# Patient Record
Sex: Female | Born: 1970 | Race: White | Hispanic: No | Smoking: Never smoker
Health system: Southern US, Community
[De-identification: ages and names within clinical notes are randomized; demographics above are authoritative.]

## PROBLEM LIST (undated history)

## (undated) DIAGNOSIS — R200 Anesthesia of skin: Secondary | ICD-10-CM

## (undated) DIAGNOSIS — R011 Cardiac murmur, unspecified: Secondary | ICD-10-CM

## (undated) DIAGNOSIS — R202 Paresthesia of skin: Secondary | ICD-10-CM

## (undated) HISTORY — DX: Anesthesia of skin: R20.0

## (undated) HISTORY — PX: TUBAL LIGATION: SHX77

## (undated) HISTORY — DX: Anesthesia of skin: R20.2

## (undated) HISTORY — PX: ABLATION: SHX5711

---

## 2007-02-01 ENCOUNTER — Emergency Department (HOSPITAL_COMMUNITY): Admission: EM | Admit: 2007-02-01 | Discharge: 2007-02-01 | Payer: Self-pay | Admitting: Emergency Medicine

## 2007-06-25 ENCOUNTER — Emergency Department (HOSPITAL_COMMUNITY): Admission: EM | Admit: 2007-06-25 | Discharge: 2007-06-26 | Payer: Self-pay | Admitting: Emergency Medicine

## 2007-07-19 ENCOUNTER — Encounter (INDEPENDENT_AMBULATORY_CARE_PROVIDER_SITE_OTHER): Payer: Self-pay | Admitting: Gynecology

## 2007-07-19 ENCOUNTER — Ambulatory Visit: Payer: Self-pay | Admitting: Gynecology

## 2008-03-10 ENCOUNTER — Emergency Department (HOSPITAL_COMMUNITY): Admission: EM | Admit: 2008-03-10 | Discharge: 2008-03-10 | Payer: Self-pay | Admitting: Emergency Medicine

## 2008-03-11 ENCOUNTER — Ambulatory Visit: Payer: Self-pay | Admitting: Oncology

## 2008-03-24 LAB — CBC WITH DIFFERENTIAL/PLATELET
BASO%: 0.6 % (ref 0.0–2.0)
Basophils Absolute: 0 10*3/uL (ref 0.0–0.1)
EOS%: 2.1 % (ref 0.0–7.0)
HCT: 38.9 % (ref 34.8–46.6)
HGB: 13.3 g/dL (ref 11.6–15.9)
MCH: 29.9 pg (ref 25.1–34.0)
MCHC: 34.2 g/dL (ref 31.5–36.0)
MCV: 87.6 fL (ref 79.5–101.0)
MONO%: 8.1 % (ref 0.0–14.0)
NEUT%: 61.3 % (ref 38.4–76.8)
RDW: 13.5 % (ref 11.2–14.5)
lymph#: 1.6 10*3/uL (ref 0.9–3.3)

## 2008-03-24 LAB — MORPHOLOGY: PLT EST: ADEQUATE

## 2008-03-27 LAB — VON WILLEBRAND PANEL
Ristocetin-Cofactor: 87 % (ref 50–150)
Von Willebrand Ag: 109 % normal (ref 61–164)

## 2008-05-11 ENCOUNTER — Emergency Department (HOSPITAL_COMMUNITY): Admission: EM | Admit: 2008-05-11 | Discharge: 2008-05-11 | Payer: Self-pay | Admitting: Emergency Medicine

## 2008-10-10 ENCOUNTER — Emergency Department (HOSPITAL_COMMUNITY): Admission: EM | Admit: 2008-10-10 | Discharge: 2008-10-10 | Payer: Self-pay | Admitting: Emergency Medicine

## 2010-04-30 LAB — URINALYSIS, ROUTINE W REFLEX MICROSCOPIC
Glucose, UA: NEGATIVE mg/dL
Ketones, ur: NEGATIVE mg/dL
Specific Gravity, Urine: 1.011 (ref 1.005–1.030)
pH: 7.5 (ref 5.0–8.0)

## 2010-05-11 LAB — DIFFERENTIAL
Basophils Absolute: 0 10*3/uL (ref 0.0–0.1)
Basophils Relative: 0 % (ref 0–1)
Eosinophils Absolute: 0.1 10*3/uL (ref 0.0–0.7)
Neutro Abs: 3.4 10*3/uL (ref 1.7–7.7)
Neutrophils Relative %: 65 % (ref 43–77)

## 2010-05-11 LAB — CBC
MCHC: 33.8 g/dL (ref 30.0–36.0)
MCV: 88.7 fL (ref 78.0–100.0)
RDW: 13.8 % (ref 11.5–15.5)

## 2010-05-11 LAB — COMPREHENSIVE METABOLIC PANEL
ALT: 22 U/L (ref 0–35)
AST: 23 U/L (ref 0–37)
Albumin: 4.5 g/dL (ref 3.5–5.2)
Alkaline Phosphatase: 76 U/L (ref 39–117)
Chloride: 106 mEq/L (ref 96–112)
GFR calc Af Amer: >60 mL/min (ref >60)
Potassium: 3.7 mEq/L (ref 3.5–5.1)
Sodium: 142 mEq/L (ref 135–145)
Total Bilirubin: 0.6 mg/dL (ref 0.3–1.2)
Total Protein: 7.7 g/dL (ref 6.0–8.3)

## 2010-05-11 LAB — PROTIME-INR: INR: 1 (ref 0.00–1.49)

## 2010-05-11 LAB — APTT: aPTT: 30 seconds (ref 24–37)

## 2010-06-08 NOTE — Group Therapy Note (Signed)
NAME:  Rebekah Reed, Rebekah Reed NO.:  [id number]   MEDICAL RECORD NO.:  [id number]          PATIENT TYPE:  WOC   LOCATION:  [facility]                   FACILITY:  [facility]   PHYSICIAN:  Ginger Carne, MD DATE OF BIRTH:  [birth year]   DATE OF SERVICE:                                  CLINIC NOTE   HISTORY OF PRESENT ILLNESS:  Ms. Rebekah Reed is here today as a 40 year old  multiparous Caucasian female who presented to the emergency department  at [facility] on June 25, 2007 because of a heavy menstrual  cycle.  Usually her menses are every 28-30 days lasting 4-5 days and  moderate in amount.  The patient states that this menses was  particularly heavy and unusual for her.  Evaluation at the time revealed  the patient to be dehydrated with a hemoglobin of 12.8.  At the time,  she was hemodynamically stable and orthostatic signs were normal.  The  patient is here today for further evaluation.   She states that until this past menses, her usual cycle has not changed  and she has no intermenstrual bleeding or pain.  She takes no  medications to enhance her bleeding propensity and has no personal  family history of bleeding diatheses.   The patient has not had a Pap smear in 7 years since the delivery of her  last child.  She has had a bilateral tubal ligation 7 years ago.  She is  a gravida 3, para 3 and has no medical or surgical problems of note.   ALLERGIES:  She is ALLERGIC TO PENICILLIN.   MEDICATIONS:  Takes no medications.   SALIENT PHYSICAL FINDINGS:  VITAL SIGNS:  Blood pressure 106/65, weight  162 pounds, height 63 inches.  ABDOMEN:  Soft without gross hepatosplenomegaly.  EXTERNAL GENITALIA/PELVIC:  Vulva and vagina normal.  Cervix smooth  without erosions or lesions.  Pap smear performed.  Uterus small,  anteverted and flexed.  Both adnexa palpable found to be normal.   IMPRESSION:  Abnormal menses.   PLAN:  The patient has had one episode of  abnormal menses and is in  agreement that the best course of action at this point is to keep track  of her menses over the next several months.  If they continue to worsen,  she would be a candidate for an endometrial biopsy and a saline infusion  sonohysterosalpingogram which she is open to.  She is in agreement that  at this time it is not necessary to proceed with same testing.  Her  urine pregnancy test was negative at this visit.           ______________________________  Ginger Carne, MD    SHB/MEDQ  D:  07/19/2007  T:  07/19/2007  Job:  [id number]

## 2010-08-09 ENCOUNTER — Emergency Department (HOSPITAL_COMMUNITY)
Admission: EM | Admit: 2010-08-09 | Discharge: 2010-08-09 | Disposition: A | Discharge status: Home / Self Care | Payer: Self-pay | Attending: Emergency Medicine | Admitting: Emergency Medicine

## 2010-08-09 DIAGNOSIS — Z23 Encounter for immunization: Secondary | ICD-10-CM | POA: Insufficient documentation

## 2010-08-09 DIAGNOSIS — IMO0002 Reserved for concepts with insufficient information to code with codable children: Secondary | ICD-10-CM | POA: Insufficient documentation

## 2010-08-09 DIAGNOSIS — Y9341 Activity, dancing: Secondary | ICD-10-CM | POA: Insufficient documentation

## 2010-08-09 DIAGNOSIS — Y9229 Other specified public building as the place of occurrence of the external cause: Secondary | ICD-10-CM | POA: Insufficient documentation

## 2010-08-09 DIAGNOSIS — S91109A Unspecified open wound of unspecified toe(s) without damage to nail, initial encounter: Secondary | ICD-10-CM | POA: Insufficient documentation

## 2010-10-21 LAB — CBC
HCT: 37.3
Hemoglobin: 12.8
MCHC: 34.3
MCV: 87.1
Platelets: 186
RBC: 4.28
RDW: 12.8
WBC: 4.9

## 2010-10-21 LAB — DIFFERENTIAL
Basophils Absolute: 0
Basophils Relative: 1
Eosinophils Absolute: 0.1
Eosinophils Relative: 2
Lymphocytes Relative: 28
Lymphs Abs: 1.4
Monocytes Absolute: 0.5
Monocytes Relative: 9
Neutro Abs: 2.9
Neutrophils Relative %: 59

## 2010-10-21 LAB — POCT I-STAT, CHEM 8
BUN: 9
Calcium, Ion: 1.15
Chloride: 103
Creatinine, Ser: 1.1
Glucose, Bld: 83
HCT: 39
Hemoglobin: 13.3
Potassium: 3.8
Sodium: 141
TCO2: 29

## 2010-10-21 LAB — POCT PREGNANCY, URINE
Operator id: 244461
Operator id: 297281
Preg Test, Ur: NEGATIVE
Preg Test, Ur: NEGATIVE

## 2011-02-05 ENCOUNTER — Emergency Department (HOSPITAL_BASED_OUTPATIENT_CLINIC_OR_DEPARTMENT_OTHER)
Admission: EM | Admit: 2011-02-05 | Discharge: 2011-02-05 | Disposition: A | Discharge status: Home / Self Care | Payer: Self-pay | Attending: Emergency Medicine | Admitting: Emergency Medicine

## 2011-02-05 ENCOUNTER — Encounter (HOSPITAL_BASED_OUTPATIENT_CLINIC_OR_DEPARTMENT_OTHER): Payer: Self-pay | Admitting: *Deleted

## 2011-02-05 DIAGNOSIS — K0889 Other specified disorders of teeth and supporting structures: Secondary | ICD-10-CM

## 2011-02-05 DIAGNOSIS — K089 Disorder of teeth and supporting structures, unspecified: Secondary | ICD-10-CM | POA: Insufficient documentation

## 2011-02-05 MED ORDER — HYDROCODONE-ACETAMINOPHEN 5-325 MG PO TABS: 2.0000 | ORAL_TABLET | ORAL | Status: AC | PRN

## 2011-02-05 NOTE — ED Provider Notes (Signed)
History     CSN: [id number]  Arrival date & time 02/05/11  1714   First MD Initiated Contact with Patient 02/05/11 1823      Chief Complaint  Patient presents with  . Dental Pain    (Consider location/radiation/quality/duration/timing/severity/associated sxs/prior treatment) HPI  History reviewed. No pertinent past medical history.  History reviewed. No pertinent past surgical history.  History reviewed. No pertinent family history.  History  Substance Use Topics  . Smoking status: Never Smoker   . Smokeless tobacco: Not on file  . Alcohol Use: No    OB History    Grav Para Term Preterm Abortions TAB SAB Ect Mult Living                  Review of Systems  Allergies  Penicillins  Home Medications   Current Outpatient Rx  Name Route Sig Dispense Refill  . ACETAMINOPHEN 500 MG PO TABS Oral Take 1,000 mg by mouth every 6 (six) hours as needed. For toothache    . NAPROXEN SODIUM 220 MG PO TABS Oral Take 440 mg by mouth once as needed. For toothache      BP 118/71  Pulse 68  Temp(Src) 98.8 F (37.1 C) (Oral)  Resp 18  Ht 5\' 3"  (1.6 m)  Wt 182 lb (82.555 kg)  BMI 32.24 kg/m2  SpO2 100%  LMP 12/22/2010  Physical Exam  ED Course  Procedures (including critical care time)  Labs Reviewed - No data to display No results found.   No diagnosis found.    MDM  Duplicate note        Doug Sou, MD 02/06/11 0020

## 2011-02-05 NOTE — ED Provider Notes (Signed)
History  This chart was scribed for Doug Sou, MD by Bennett Scrape. This patient was seen in room [facility] and the patient's care was started at 6:47PM.  CSN: [id number]  Arrival date & time 02/05/11  1714   First MD Initiated Contact with Patient 02/05/11 1823      Chief Complaint  Patient presents with  . Dental Pain   Patient is a 41 y.o. female presenting with tooth pain.  Dental PainPrimary symptoms do not include mouth pain, dental injury, oral bleeding, headaches, fever, sore throat or cough. The symptoms began yesterday. The symptoms are worsening. The symptoms are new. The symptoms occur constantly.  Additional symptoms do not include: gum swelling, gum tenderness, jaw pain, facial swelling, trouble swallowing, pain with swallowing, taste disturbance and smell disturbance.    Rebekah Reed is a 41 y.o. female who presents to the Emergency Department complaining of right-sided dental pain that began 24 hours ago. Pt states that she has a cavity in the left lower 2nd molar. She states that ice improves her symptoms. She reports that she aleve with no improvement in her symptoms. Pt has not been to see a dentist in 5 or 6 years, because she states that she can't afford treatment. She has no h/o  other chronic medical conditions. She denies smoking and alcohol use.  History reviewed. No pertinent past medical history.  History reviewed. No pertinent past surgical history.  History reviewed. No pertinent family history.  History  Substance Use Topics  . Smoking status: Never Smoker   . Smokeless tobacco: Not on file  . Alcohol Use: No    OB History    Grav Para Term Preterm Abortions TAB SAB Ect Mult Living                  Review of Systems  Constitutional: Negative for fever and chills.  HENT: Negative for sore throat, facial swelling, rhinorrhea and trouble swallowing.   Respiratory: Negative for cough.   Cardiovascular: Negative for chest pain.    Gastrointestinal: Negative for nausea, vomiting and diarrhea.  Skin: Negative for rash.  Neurological: Negative for headaches.  All other systems reviewed and are negative.    Allergies  Penicillins  Home Medications   Current Outpatient Rx  Name Route Sig Dispense Refill  . ACETAMINOPHEN 500 MG PO TABS Oral Take 1,000 mg by mouth every 6 (six) hours as needed. For toothache    . NAPROXEN SODIUM 220 MG PO TABS Oral Take 440 mg by mouth once as needed. For toothache      Triage Vitals: BP 118/71  Pulse 68  Temp(Src) 98.8 F (37.1 C) (Oral)  Resp 18  Ht 5\' 3"  (1.6 m)  Wt 182 lb (82.555 kg)  BMI 32.24 kg/m2  SpO2 100%  LMP 12/22/2010  Physical Exam  Nursing note and vitals reviewed. Constitutional: She is oriented to person, place, and time. She appears well-developed and well-nourished.       Wide spread dental decay, left lower 2nd molar pain, no swelling or flutuance of gingiva   HENT:  Head: Normocephalic and atraumatic.       Bilateral TMs are normal  Eyes: Conjunctivae and EOM are normal.  Neck: Normal range of motion. Neck supple.  Cardiovascular: Normal rate.   Pulmonary/Chest: Effort normal.  Abdominal: She exhibits no distension.  Musculoskeletal: Normal range of motion. She exhibits no edema.  Lymphadenopathy:    She has no cervical adenopathy.  Neurological: She is alert and oriented  to person, place, and time. No cranial nerve deficit.  Skin: Skin is warm and dry. No rash noted.  Psychiatric: She has a normal mood and affect. Her behavior is normal.    ED Course  Procedures (including critical care time)  DIAGNOSTIC STUDIES: Oxygen Saturation is 100% on room air, normal by my interpretation.    COORDINATION OF CARE: 6:49PM-Will prescribe pain medication and will refer a dentist to the pt.   Labs Reviewed - No data to display No results found.   No diagnosis found.    MDM  Narcotic pain medicine not given in the emergency department, as  patient driving home Plan prescription hydrocodone-A. Pap Dental referral Dr. Mayford Knife Diagnosis dental pain    I personally performed the services described in this documentation, which was scribed in my presence. The recorded information has been reviewed and considered.      Doug Sou, MD 02/05/11 1859

## 2011-02-05 NOTE — ED Notes (Signed)
Toothache (right lower) onset last p.m.

## 2011-11-04 ENCOUNTER — Emergency Department (HOSPITAL_BASED_OUTPATIENT_CLINIC_OR_DEPARTMENT_OTHER): Payer: Self-pay

## 2011-11-04 ENCOUNTER — Emergency Department (HOSPITAL_BASED_OUTPATIENT_CLINIC_OR_DEPARTMENT_OTHER)
Admission: EM | Admit: 2011-11-04 | Discharge: 2011-11-05 | Disposition: A | Discharge status: Home / Self Care | Payer: Self-pay | Attending: Emergency Medicine | Admitting: Emergency Medicine

## 2011-11-04 ENCOUNTER — Encounter (HOSPITAL_BASED_OUTPATIENT_CLINIC_OR_DEPARTMENT_OTHER): Payer: Self-pay | Admitting: *Deleted

## 2011-11-04 DIAGNOSIS — Z880 Allergy status to penicillin: Secondary | ICD-10-CM | POA: Insufficient documentation

## 2011-11-04 DIAGNOSIS — M25559 Pain in unspecified hip: Secondary | ICD-10-CM | POA: Insufficient documentation

## 2011-11-04 NOTE — ED Notes (Signed)
Left hip pain x 2 months. Drove herself here.

## 2011-11-05 MED ORDER — IBUPROFEN 600 MG PO TABS: 600.0000 mg | ORAL_TABLET | Freq: Four times a day (QID) | ORAL | Status: AC | PRN

## 2011-11-05 MED ORDER — HYDROCODONE-ACETAMINOPHEN 5-325 MG PO TABS: 1.0000 | ORAL_TABLET | Freq: Four times a day (QID) | ORAL | Status: DC | PRN

## 2011-11-05 NOTE — ED Provider Notes (Signed)
History     CSN: [id number]  Arrival date & time 11/04/11  2201   First MD Initiated Contact with Patient 11/05/11 0207      Chief Complaint  Patient presents with  . Hip Pain    (Consider location/radiation/quality/duration/timing/severity/associated sxs/prior treatment) HPI The patient presents with concerns of ongoing left hip pain.  She notes that the pain began insidiously approximately 2 months ago.  Since onset the pain has been persistent.  Notably, the pain was progressive today he.  The pain is sharp, focally about the left lateral hip with minimal radiation.  Pain is worse with weightbearing on the left hip, mildly improved with Advil, though this did not improve the pain today.  She denies associated distal pain, weakness, any fevers, chills, nausea, vomiting or any other focal complaints. 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 History reviewed. No pertinent past medical history.  History reviewed. No pertinent past surgical history.  No family history on file.  History  Substance Use Topics  . Smoking status: Never Smoker   . Smokeless tobacco: Not on file  . Alcohol Use: No    OB History    Grav Para Term Preterm Abortions TAB SAB Ect Mult Living                  Review of Systems  All other systems reviewed and are negative.    Allergies  Penicillins  Home Medications   Current Outpatient Rx  Name Route Sig Dispense Refill  . HYDROCODONE-ACETAMINOPHEN 5-325 MG PO TABS Oral Take 1 tablet by mouth every 6 (six) hours as needed for pain. 10 tablet 0  . IBUPROFEN 600 MG PO TABS Oral Take 1 tablet (600 mg total) by mouth every 6 (six) hours as needed for pain. 12 tablet 0    BP 102/63  Pulse 66  Temp 97.8 F (36.6 C) (Oral)  Resp 20  SpO2 99%  LMP 10/21/2011  Physical Exam  Nursing note and vitals reviewed. Constitutional: She is oriented to person, place, and time.  She appears well-developed and well-nourished. No distress.  HENT:  Head: Normocephalic and atraumatic.  Eyes: Conjunctivae normal and EOM are normal.  Cardiovascular: Normal rate and regular rhythm.   Pulmonary/Chest: Effort normal and breath sounds normal. No stridor. No respiratory distress.  Abdominal: She exhibits no distension.  Musculoskeletal: She exhibits no edema.       Mild tenderness to palpation about the left lateral hip, though the patient can flex, extend, abduct, adduct the hip with appropriate strength throughout.  She notes the greatest pain is with hip abduction, and the pain is focally about the left lateral hip.  Neurological: She is alert and oriented to person, place, and time. No cranial nerve deficit.  Skin: Skin is warm and dry.  Psychiatric: She has a normal mood and affect.    ED Course  Procedures (including critical care time)  Labs Reviewed - No data to display Dg Hip Complete Left  11/04/2011  *RADIOLOGY REPORT*  Clinical Data: Pain.  LEFT HIP - COMPLETE 2+ VIEW  Comparison: None.  Findings: Sclerosis noted along the iliac sides of the sacroiliac joints, query osteitis condensans ilii.  Probable small degenerative subcortical cystic lesion noted in the right acetabular roof.  No specific conventional radiographic findings of avascular necrosis.  Faint lucency projecting over the left acetabulum may reflect a small posterior acetabular subcortical cyst.  No fracture or  acute bony findings observed.  IMPRESSION:  1.  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 2.  Suspected degenerative subcortical cystic lesions along the acetabula bilaterally.   Original Report Authenticated By: Dellia Cloud, M.D.      1. Hip pain       MDM  This previously well female presents with ongoing left hip pain.  On exam she is in no distress.  Patient has no neurologic deficits, nor any vital sign abnormalities  suggestive of acute ongoing infectious pathology, such as septic joint.  Patient's radiographic studies demonstrate degenerative changes.  This was discussed with the patient.  She was discharged with analgesics, orthopedics followup.     Gerhard Munch, MD 11/05/11 [phone]

## 2012-02-27 ENCOUNTER — Encounter (HOSPITAL_BASED_OUTPATIENT_CLINIC_OR_DEPARTMENT_OTHER): Payer: Self-pay | Admitting: *Deleted

## 2012-02-27 ENCOUNTER — Emergency Department (HOSPITAL_BASED_OUTPATIENT_CLINIC_OR_DEPARTMENT_OTHER)
Admission: EM | Admit: 2012-02-27 | Discharge: 2012-02-27 | Disposition: A | Discharge status: Home / Self Care | Payer: Self-pay | Attending: Emergency Medicine | Admitting: Emergency Medicine

## 2012-02-27 DIAGNOSIS — R490 Dysphonia: Secondary | ICD-10-CM | POA: Insufficient documentation

## 2012-02-27 DIAGNOSIS — R1013 Epigastric pain: Secondary | ICD-10-CM | POA: Insufficient documentation

## 2012-02-27 DIAGNOSIS — Z79899 Other long term (current) drug therapy: Secondary | ICD-10-CM | POA: Insufficient documentation

## 2012-02-27 DIAGNOSIS — R059 Cough, unspecified: Secondary | ICD-10-CM | POA: Insufficient documentation

## 2012-02-27 DIAGNOSIS — R05 Cough: Secondary | ICD-10-CM | POA: Insufficient documentation

## 2012-02-27 DIAGNOSIS — R12 Heartburn: Secondary | ICD-10-CM

## 2012-02-27 DIAGNOSIS — J040 Acute laryngitis: Secondary | ICD-10-CM

## 2012-02-27 MED ORDER — OMEPRAZOLE 20 MG PO CPDR: 20.0000 mg | DELAYED_RELEASE_CAPSULE | Freq: Every day | ORAL | Status: DC

## 2012-02-27 NOTE — ED Provider Notes (Signed)
History     CSN: [id number]  Arrival date & time 02/27/12  0709   First MD Initiated Contact with Patient 02/27/12 [phone]      Chief Complaint  Patient presents with  . Sore Throat  . Cough    (Consider location/radiation/quality/duration/timing/severity/associated sxs/prior treatment) HPI The patient presents with concerns of new hoarseness, burning epigastric/chest pain. Her primary concern is hoarseness, but developed within the past week, has not improved appreciably with OTC medication.  There is no associated dyspnea, dysphagia, dysphasia.  There is mild scratchy sensation associated with with her hoarseness.  She also notes new intermittent chest burning sensation in the inferior sternum, epigastrium, entirely midline, with no radiation.  There is no pleuritic, exertional chest pain. The patient does not smoke, does not drink.  History reviewed. No pertinent past medical history.  Past Surgical History  Procedure Date  . Tubal ligation     No family history on file.  History  Substance Use Topics  . Smoking status: Never Smoker   . Smokeless tobacco: Not on file  . Alcohol Use: No    OB History    Grav Para Term Preterm Abortions TAB SAB Ect Mult Living                  Review of Systems  All other systems reviewed and are negative.    Allergies  Penicillins  Home Medications   Current Outpatient Rx  Name  Route  Sig  Dispense  Refill  . GUAIFENESIN-DM 100-10 MG/5ML PO SYRP   Oral   Take 5 mLs by mouth 3 (three) times daily as needed.         . IBUPROFEN 200 MG PO TABS   Oral   Take 400 mg by mouth every 6 (six) hours as needed.         Marland Kitchen HYDROCODONE-ACETAMINOPHEN 5-325 MG PO TABS   Oral   Take 1 tablet by mouth every 6 (six) hours as needed for pain.   10 tablet   0   . OMEPRAZOLE 20 MG PO CPDR   Oral   Take 1 capsule (20 mg total) by mouth daily.   21 capsule   0     BP 118/78  Pulse 91  Temp 97.4 F (36.3 C) (Oral)  Resp 20   Ht 5\' 4"  (1.626 m)  Wt 180 lb (81.647 kg)  BMI 30.90 kg/m2  SpO2 99%  Physical Exam  Nursing note and vitals reviewed. Constitutional: She is oriented to person, place, and time. She appears well-developed and well-nourished. No distress.  HENT:  Head: Normocephalic and atraumatic.  Mouth/Throat: Uvula is midline, oropharynx is clear and moist and mucous membranes are normal.  Eyes: Conjunctivae normal and EOM are normal.  Cardiovascular: Normal rate and regular rhythm.   Pulmonary/Chest: Effort normal and breath sounds normal. No stridor. No respiratory distress.  Abdominal: She exhibits no distension.  Musculoskeletal: She exhibits no edema.  Lymphadenopathy:       Head (right side): No preauricular, no posterior auricular and no occipital adenopathy present.       Head (left side): No preauricular, no posterior auricular and no occipital adenopathy present.       Right cervical: No superficial cervical and no posterior cervical adenopathy present.      Left cervical: No superficial cervical and no posterior cervical adenopathy present.  Neurological: She is alert and oriented to person, place, and time. No cranial nerve deficit.  Skin: Skin is warm and  dry.  Psychiatric: She has a normal mood and affect.    ED Course  Procedures (including critical care time)  Labs Reviewed - No data to display No results found.   1. Laryngitis   2. Heartburn       MDM  This generally well [place] female now presents with ongoing vocal loss, intermittent burning sensation in her epigastrium and sternum.  With this accommodation symptoms, there is suspicion for reflux associated laryngitis.  Absent distress, fever, dyspnea, other chest pain, there is low suspicion for acute pulmonary or cardiovascular pathology.  The patient has minimal risk factors for these entities as well.  We discussed the need for follow with both primary care, and specialist care as needed.  The patient was started  on PPI, and advised to use OTC analgesics, anti-inflammatories.  She was discharged in stable condition.    Gerhard Munch, MD 02/27/12 [phone]

## 2012-02-27 NOTE — ED Notes (Signed)
Patient states she has a non productive cough x 1 week.  States she has had a scratchy throat for the last 3 days with laryngitis.  Denies fever.

## 2013-09-18 ENCOUNTER — Emergency Department (HOSPITAL_BASED_OUTPATIENT_CLINIC_OR_DEPARTMENT_OTHER)
Admission: EM | Admit: 2013-09-18 | Discharge: 2013-09-18 | Disposition: A | Discharge status: Home / Self Care | Payer: Self-pay | Attending: Emergency Medicine | Admitting: Emergency Medicine

## 2013-09-18 ENCOUNTER — Encounter (HOSPITAL_BASED_OUTPATIENT_CLINIC_OR_DEPARTMENT_OTHER): Payer: Self-pay | Admitting: Emergency Medicine

## 2013-09-18 ENCOUNTER — Emergency Department (HOSPITAL_BASED_OUTPATIENT_CLINIC_OR_DEPARTMENT_OTHER): Payer: Self-pay

## 2013-09-18 DIAGNOSIS — Z79899 Other long term (current) drug therapy: Secondary | ICD-10-CM | POA: Insufficient documentation

## 2013-09-18 DIAGNOSIS — M79672 Pain in left foot: Secondary | ICD-10-CM

## 2013-09-18 DIAGNOSIS — M7730 Calcaneal spur, unspecified foot: Secondary | ICD-10-CM | POA: Insufficient documentation

## 2013-09-18 DIAGNOSIS — M79609 Pain in unspecified limb: Secondary | ICD-10-CM | POA: Insufficient documentation

## 2013-09-18 DIAGNOSIS — M7732 Calcaneal spur, left foot: Secondary | ICD-10-CM

## 2013-09-18 DIAGNOSIS — Z880 Allergy status to penicillin: Secondary | ICD-10-CM | POA: Insufficient documentation

## 2013-09-18 DIAGNOSIS — M79671 Pain in right foot: Secondary | ICD-10-CM

## 2013-09-18 NOTE — ED Provider Notes (Signed)
Medical screening examination/treatment/procedure(s) were performed by non-physician practitioner and as supervising physician I was immediately available for consultation/collaboration.   EKG Interpretation None        Vanetta Mulders, MD 09/18/13 [phone]

## 2013-09-18 NOTE — ED Provider Notes (Signed)
CSN: [id number]     Arrival date & time 09/18/13  1628 History   First MD Initiated Contact with Patient 09/18/13 1753     Chief Complaint  Patient presents with  . Foot Pain     (Consider location/radiation/quality/duration/timing/severity/associated sxs/prior Treatment) HPI Comments: Patient is a 43 year old female who presents to the emergency department complaining of bilateral heel pain x2 weeks. Patient reports her pain is worsened if she starts walking after sitting for long period of time or first thing in the morning. She reports yesterday the pain increased and gave her a sharp shooting pain in her heel. No known injury or trauma. She has not tried any alleviating factors for her symptoms. Denies fever or chills.  Patient is a 43 y.o. female presenting with lower extremity pain. The history is provided by the patient.  Foot Pain Pertinent negatives include no fever or numbness.    History reviewed. No pertinent past medical history. Past Surgical History  Procedure Laterality Date  . Tubal ligation     No family history on file. History  Substance Use Topics  . Smoking status: Never Smoker   . Smokeless tobacco: Not on file  . Alcohol Use: No   OB History   Grav Para Term Preterm Abortions TAB SAB Ect Mult Living                 Review of Systems  Constitutional: Negative for fever.  HENT: Negative.   Cardiovascular: Negative.   Musculoskeletal:       + bilateral heel pain.  Neurological: Negative for numbness.      Allergies  Penicillins  Home Medications   Prior to Admission medications   Medication Sig Start Date End Date Taking? Authorizing Provider  guaiFENesin-dextromethorphan (ROBITUSSIN DM) 100-10 MG/5ML syrup Take 5 mLs by mouth 3 (three) times daily as needed.    Historical Provider, MD  HYDROcodone-acetaminophen (NORCO/VICODIN) 5-325 MG per tablet Take 1 tablet by mouth every 6 (six) hours as needed for pain. 11/05/11   Gerhard Munch, MD   ibuprofen (ADVIL,MOTRIN) 200 MG tablet Take 400 mg by mouth every 6 (six) hours as needed.    Historical Provider, MD  omeprazole (PRILOSEC) 20 MG capsule Take 1 capsule (20 mg total) by mouth daily. 02/27/12   Gerhard Munch, MD   BP 128/72  Pulse 80  Temp(Src) 98.9 F (37.2 C) (Oral)  Resp 18  Ht  (1.6 m)  Wt 210 lb (95.255 kg)  BMI 37.21 kg/m2  SpO2 98%  LMP 08/21/2013 Physical Exam  Nursing note and vitals reviewed. Constitutional: She is oriented to person, place, and time. She appears well-developed and well-nourished. No distress.  HENT:  Head: Normocephalic and atraumatic.  Mouth/Throat: Oropharynx is clear and moist.  Eyes: Conjunctivae and EOM are normal.  Neck: Normal range of motion. Neck supple.  Cardiovascular: Normal rate, regular rhythm and normal heart sounds.   Pulmonary/Chest: Effort normal and breath sounds normal. No respiratory distress.  Musculoskeletal: Normal range of motion. She exhibits no edema.  TTP bilateral heels on plantar aspect and around sides. No swelling. No ankle tenderness. FROM bilateral ankles and feet. Tenderness is not extend to plantar fascia.  Neurological: She is alert and oriented to person, place, and time. No sensory deficit.  Skin: Skin is warm and dry.  Psychiatric: She has a normal mood and affect. Her behavior is normal.    ED Course  Procedures (including critical care time) Labs Review Labs Reviewed - No data to  display  Imaging Review Dg Os Calcis Left  09/18/2013   CLINICAL DATA:  Left-sided pain.  No recent trauma.  EXAM: LEFT OS CALCIS - 2+ VIEW  COMPARISON:  None  FINDINGS: AP and lateral views of the left calcaneus.  Small Achilles and calcaneal spurs. No evidence of fracture or stress fracture.  IMPRESSION: Small Achilles and calcaneal spurs.   Electronically Signed   By: Jeronimo Greaves M.D.   On: 09/18/2013 18:49   Dg Os Calcis Right  09/18/2013   CLINICAL DATA:  Right heel pain  EXAM: RIGHT OS CALCIS - 2+ VIEW   COMPARISON:  None.  FINDINGS: Two view exam of the right calcaneus shows no evidence of fracture. No worrisome lytic or sclerotic osseous abnormality. Tiny spur is identified at the insertion of the Achilles tendon.  IMPRESSION: No acute bony abnormality.   Electronically Signed   By: Kennith Center M.D.   On: 09/18/2013 18:48     EKG Interpretation None      MDM   Final diagnoses:  Heel spur, left  Heel pain, bilateral   Patient presenting with bilateral heel pain. She is well appearing and in no apparent distress. Afebrile, vital signs stable. No deformity on exam. X-rays showing small Achilles and calcaneal spurs on the left, normal on the right. I advised her to use heel inserts into her shoes along with ruling the bottom of her feet with either a golf ball or lacrosse ball. NSAIDs for pain. Followup with PCP. Stable for discharge. Return precautions given. Patient states understanding of treatment care plan and is agreeable.    Trevor Mace, PA-C 09/18/13 1912

## 2013-09-18 NOTE — ED Notes (Signed)
Reports heel pain x 2-3 months. Sts intensified yesterday. Worse with walking. Denies injury. Bilateral heels

## 2013-09-18 NOTE — Discharge Instructions (Signed)
You may try to use heel inserts for your shoes. Try to stretch out the bottom of your foot with either a golf ball or a lacrosse ball. Take ibuprofen or Tylenol as needed for pain.  Heel Spur A heel spur is a hook of bone that can form on the calcaneus (the heel bone and the largest bone of the foot). Heel spurs are often associated with plantar fasciitis and usually come in people who have had the problem for an extended period of time. The cause of the relationship is unknown. The pain associated with them is thought to be caused by an inflammation (soreness and redness) of the plantar fascia rather than the spur itself. The plantar fascia is a thick fibrous like tissue that runs from the calcaneus (heel bone) to the ball of the foot. This strong, tight tissue helps maintain the arch of your foot. It helps distribute the weight across your foot as you walk or run. Stresses placed on the plantar fascia can be tremendous. When it is inflamed normal activities become painful. Pain is worse in the morning after sleeping. After sleeping the plantar fascia is tight. The first movements stretch the fascia and this causes pain. As the tendon loosens, the pain usually gets better. It often returns with too much standing or walking.  About 70% of patients with plantar fasciitis have a heel spur. About half of people without foot pain also have heel spurs. DIAGNOSIS  The diagnosis of a heel spur is made by X-ray. The X-ray shows a hook of bone protruding from the bottom of the calcaneus at the point where the plantar fascia is attached to the heel bone.  TREATMENT  It is necessary to find out what is causing the stretching of the plantar fascia. If the cause is over-pronation (flat feet), orthotics and proper foot ware may help.  Stretching exercises, losing weight, wearing shoes that have a cushioned heel that absorbs shock, and elevating the heel with the use of a heel cradle, heel cup, or orthotics may all  help. Heel cradles and heel cups provide extra comfort and cushion to the heel, and reduce the amount of shock to the sore area. AVOIDING THE PAIN OF PLANTAR FASCIITIS AND HEEL SPURS  Consult a sports medicine professional before beginning a new exercise program.  Walking programs offer a good workout. There is a lower chance of overuse injuries common to the runners. There is less impact and less jarring of the joints.  Begin all new exercise programs slowly. If problems or pains develop, decrease the amount of time or distance until you are at a comfortable level.  Wear good shoes and replace them regularly.  Stretch your foot and the heel cords at the back of the ankle (Achilles tendons) both before and after exercise.  Run or exercise on even surfaces that are not hard. For example, asphalt is better than pavement.  Do not run barefoot on hard surfaces.  If using a treadmill, vary the incline.  Do not continue to workout if you have foot or joint problems. Seek professional help if they do not improve. HOME CARE INSTRUCTIONS   Avoid activities that cause you pain until you recover.  Use ice or cold packs to the problem or painful areas after working out.  Only take over-the-counter or prescription medicines for pain, discomfort, or fever as directed by your caregiver.  Soft shoe inserts or athletic shoes with air or gel sole cushions may be helpful.  If  problems continue or become more severe, consult a sports medicine caregiver. Cortisone is a potent anti-inflammatory medication that may be injected into the painful area. You can discuss this treatment with your caregiver. MAKE SURE YOU:   Understand these instructions.  Will watch your condition.  Will get help right away if you are not doing well or get worse. Document Released: 02/16/2005 Document Revised: 04/04/2011 Document Reviewed: 03/13/2013 [facility] Patient Information 2015 [place], [place]. This information is not  intended to replace advice given to you by your health care provider. Make sure you discuss any questions you have with your health care provider.

## 2014-02-28 ENCOUNTER — Emergency Department (HOSPITAL_BASED_OUTPATIENT_CLINIC_OR_DEPARTMENT_OTHER)
Admission: EM | Admit: 2014-02-28 | Discharge: 2014-03-01 | Disposition: A | Discharge status: Home / Self Care | Payer: 59 | Attending: Emergency Medicine | Admitting: Emergency Medicine

## 2014-02-28 ENCOUNTER — Encounter (HOSPITAL_BASED_OUTPATIENT_CLINIC_OR_DEPARTMENT_OTHER): Payer: Self-pay | Admitting: Emergency Medicine

## 2014-02-28 DIAGNOSIS — Z3202 Encounter for pregnancy test, result negative: Secondary | ICD-10-CM | POA: Diagnosis not present

## 2014-02-28 DIAGNOSIS — N939 Abnormal uterine and vaginal bleeding, unspecified: Secondary | ICD-10-CM | POA: Insufficient documentation

## 2014-02-28 DIAGNOSIS — Z79899 Other long term (current) drug therapy: Secondary | ICD-10-CM | POA: Diagnosis not present

## 2014-02-28 DIAGNOSIS — Z880 Allergy status to penicillin: Secondary | ICD-10-CM | POA: Diagnosis not present

## 2014-02-28 LAB — URINALYSIS, ROUTINE W REFLEX MICROSCOPIC
Bilirubin Urine: NEGATIVE
GLUCOSE, UA: NEGATIVE mg/dL
Ketones, ur: NEGATIVE mg/dL
Leukocytes, UA: NEGATIVE
Nitrite: NEGATIVE
Protein, ur: NEGATIVE mg/dL
Specific Gravity, Urine: 1.004 — ABNORMAL LOW (ref 1.005–1.030)
Urobilinogen, UA: 0.2 mg/dL (ref 0.0–1.0)
pH: 7.5 (ref 5.0–8.0)

## 2014-02-28 LAB — PREGNANCY, URINE: Preg Test, Ur: NEGATIVE

## 2014-02-28 LAB — CBC
HCT: 36.7 % (ref 36.0–46.0)
Hemoglobin: 12 g/dL (ref 12.0–15.0)
MCH: 28.8 pg (ref 26.0–34.0)
MCHC: 32.7 g/dL (ref 30.0–36.0)
MCV: 88 fL (ref 78.0–100.0)
Platelets: 233 10*3/uL (ref 150–400)
RBC: 4.17 MIL/uL (ref 3.87–5.11)
RDW: 14 % (ref 11.5–15.5)
WBC: 5.5 10*3/uL (ref 4.0–10.5)

## 2014-02-28 LAB — URINE MICROSCOPIC-ADD ON

## 2014-02-28 NOTE — ED Provider Notes (Signed)
TIME SEEN: 11:15 PM  CHIEF COMPLAINT: vaginal bleeding  HPI:  Rebekah Reed is a 44 y.o. female with no significant past medical history who presents to the Emergency Department complaining of vaginal bleeding onset 3 days ago. States that this is the normal timeframe for her period.  She reports that normally she bleeds heavily on the first day of her menstrual cycle but this week, the bleeding has been heavy throughout. She denies being on birth control or a hormone supplement. She reports that she does not have a PCP or OB/GYN. No prior history of uterine fibroids. She denies being on anticoagulation. She reports that she is sexually active with one partner.  No prior history of STDs. She is status post BTL. No vaginal discharge.  ROS: See HPI Constitutional: no fever  Eyes: no drainage  ENT: no runny nose   Cardiovascular:  no chest pain  Resp: no SOB  GI: no vomiting GU: no dysuria Integumentary: no rash  Allergy: no hives  Musculoskeletal: no leg swelling  Neurological: no slurred speech ROS otherwise negative  PAST MEDICAL HISTORY/PAST SURGICAL HISTORY:  History reviewed. No pertinent past medical history.  MEDICATIONS:  Prior to Admission medications   Medication Sig Start Date End Date Taking? Authorizing Provider  guaiFENesin-dextromethorphan (ROBITUSSIN DM) 100-10 MG/5ML syrup Take 5 mLs by mouth 3 (three) times daily as needed.    Historical Provider, MD  HYDROcodone-acetaminophen (NORCO/VICODIN) 5-325 MG per tablet Take 1 tablet by mouth every 6 (six) hours as needed for pain. 11/05/11   Gerhard Munch, MD  ibuprofen (ADVIL,MOTRIN) 200 MG tablet Take 400 mg by mouth every 6 (six) hours as needed.    Historical Provider, MD  omeprazole (PRILOSEC) 20 MG capsule Take 1 capsule (20 mg total) by mouth daily. 02/27/12   Gerhard Munch, MD    ALLERGIES:  Allergies  Allergen Reactions  . Penicillins Hives and Swelling    SOCIAL HISTORY:  History  Substance Use Topics   . Smoking status: Never Smoker   . Smokeless tobacco: Not on file  . Alcohol Use: No    FAMILY HISTORY: History reviewed. No pertinent family history.  EXAM: BP 106/57 mmHg  Pulse 87  Temp(Src) 97.8 F (36.6 C) (Oral)  Resp 16  Wt 215 lb (97.523 kg)  SpO2 100%  LMP 02/28/2014 CONSTITUTIONAL: Alert and oriented and responds appropriately to questions. Well-appearing; well-nourished HEAD: Normocephalic EYES: Conjunctivae clear, PERRL ENT: normal nose; no rhinorrhea; moist mucous membranes; pharynx without lesions noted NECK: Supple, no meningismus, no LAD  CARD: RRR; S1 and S2 appreciated; no murmurs, no clicks, no rubs, no gallops RESP: Normal chest excursion without splinting or tachypnea; breath sounds clear and equal bilaterally; no wheezes, no rhonchi, no rales,  ABD/GI: Normal bowel sounds; non-distended; soft, non-tender, no rebound, no guarding GU:  Patient has very mild amount of dark red blood coming from the cervical os, no adnexal tenderness or fullness, no cervical motion tenderness, no vaginal discharge, normal external genitalia BACK:  The back appears normal and is non-tender to palpation, there is no CVA tenderness EXT: Normal ROM in all joints; non-tender to palpation; no edema; normal capillary refill; no cyanosis    SKIN: Normal color for age and race; warm NEURO: Moves all extremities equally PSYCH: The patient's mood and manner are appropriate. Grooming and personal hygiene are appropriate.  MEDICAL DECISION MAKING: Pt here with increased vaginal bleeding during her menstrual cycle. She is hemodynamically stable. Hemoglobin is 12.0. She has very mild bleeding on  pelvic exam. Bleeding appears to be coming from the cervical os. Her abdominal exam is benign. I do not feel she needs abdominal imaging emergently at this time. We'll give her outpatient OB/GYN follow-up information. She is not pregnant. No sign of urinary tract infection.  Wet prep is positive for clue  cells but she has no vaginal discharge or odor or irritation. Discussed with patient starting her on birth control taper for her heavy vaginal bleeding and she agrees to this plan. Discussed return precautions including bleeding return precautions. She verbalized understanding and is comfortable with plan.       Layla MawKristen N Ward, DO 03/01/14 [phone]

## 2014-02-28 NOTE — ED Notes (Signed)
Patient reports that she is having heavy vaginal bleeding, going through 1 overnight pad every 15 minutes. The patient reports heavy bleeding since tues.

## 2014-03-01 LAB — WET PREP, GENITAL
Trich, Wet Prep: NONE SEEN
WBC, Wet Prep HPF POC: NONE SEEN
Yeast Wet Prep HPF POC: NONE SEEN

## 2014-03-01 MED ORDER — NORGESTIMATE-ETH ESTRADIOL 0.25-35 MG-MCG PO TABS: 1.0000 | ORAL_TABLET | Freq: Every day | ORAL | Status: DC

## 2014-03-01 MED ORDER — IBUPROFEN 800 MG PO TABS: 800.0000 mg | ORAL_TABLET | Freq: Three times a day (TID) | ORAL | Status: DC | PRN

## 2014-03-01 NOTE — ED Notes (Signed)
MD at bedside discussing test results and dispo plan of care. 

## 2014-03-01 NOTE — Discharge Instructions (Signed)
Abnormal Uterine Bleeding Abnormal uterine bleeding can affect women at various stages in life, including teenagers, women in their reproductive years, pregnant women, and women who have reached menopause. Several kinds of uterine bleeding are considered abnormal, including:  Bleeding or spotting between periods.   Bleeding after sexual intercourse.   Bleeding that is heavier or more than normal.   Periods that last longer than usual.  Bleeding after menopause.  Many cases of abnormal uterine bleeding are minor and simple to treat, while others are more serious. Any type of abnormal bleeding should be evaluated by your health care provider. Treatment will depend on the cause of the bleeding. HOME CARE INSTRUCTIONS Monitor your condition for any changes. The following actions may help to alleviate any discomfort you are experiencing:  Avoid the use of tampons and douches as directed by your health care provider.  Change your pads frequently. You should get regular pelvic exams and Pap tests. Keep all follow-up appointments for diagnostic tests as directed by your health care provider.  SEEK MEDICAL CARE IF:   Your bleeding lasts more than 1 week.   You feel dizzy at times.  SEEK IMMEDIATE MEDICAL CARE IF:   You pass out.   You are changing pads every 15 to 30 minutes.   You have abdominal pain.  You have a fever.   You become sweaty or weak.   You are passing large blood clots from the vagina.   You start to feel nauseous and vomit. MAKE SURE YOU:   Understand these instructions.  Will watch your condition.  Will get help right away if you are not doing well or get worse. Document Released: 01/10/2005 Document Revised: 01/15/2013 Document Reviewed: 08/09/2012 [facility] Patient Information 2015 [place], [place]. This information is not intended to replace advice given to you by your health care provider. Make sure you discuss any questions you have with your  health care provider.     [email or web address] [address] [place], [place] [phone]    [facility] [email or web address] [address] [place], [place] [phone]    [facility] [address] [place], [place] [phone]   [facility] [email or web address] [address] [place], [place] [phone]   [facility] [email or web address] [address] [place], [place] [phone]   [facility] [email or web address] [address] [place], [place] [phone]

## 2014-03-03 LAB — GC/CHLAMYDIA PROBE AMP (~~LOC~~) NOT AT ARMC
CHLAMYDIA, DNA PROBE: NEGATIVE
NEISSERIA GONORRHEA: NEGATIVE

## 2014-04-27 ENCOUNTER — Emergency Department (HOSPITAL_BASED_OUTPATIENT_CLINIC_OR_DEPARTMENT_OTHER)
Admission: EM | Admit: 2014-04-27 | Discharge: 2014-04-27 | Disposition: A | Discharge status: Home / Self Care | Payer: 59 | Attending: Emergency Medicine | Admitting: Emergency Medicine

## 2014-04-27 ENCOUNTER — Encounter (HOSPITAL_BASED_OUTPATIENT_CLINIC_OR_DEPARTMENT_OTHER): Payer: Self-pay

## 2014-04-27 DIAGNOSIS — A5909 Other urogenital trichomoniasis: Secondary | ICD-10-CM

## 2014-04-27 DIAGNOSIS — Z880 Allergy status to penicillin: Secondary | ICD-10-CM | POA: Insufficient documentation

## 2014-04-27 DIAGNOSIS — Z793 Long term (current) use of hormonal contraceptives: Secondary | ICD-10-CM | POA: Diagnosis not present

## 2014-04-27 DIAGNOSIS — Z79899 Other long term (current) drug therapy: Secondary | ICD-10-CM | POA: Diagnosis not present

## 2014-04-27 DIAGNOSIS — Z3202 Encounter for pregnancy test, result negative: Secondary | ICD-10-CM | POA: Insufficient documentation

## 2014-04-27 DIAGNOSIS — R102 Pelvic and perineal pain: Secondary | ICD-10-CM | POA: Diagnosis present

## 2014-04-27 LAB — URINALYSIS, ROUTINE W REFLEX MICROSCOPIC
Bilirubin Urine: NEGATIVE
Glucose, UA: NEGATIVE mg/dL
Ketones, ur: NEGATIVE mg/dL
LEUKOCYTES UA: NEGATIVE
Nitrite: NEGATIVE
PH: 6 (ref 5.0–8.0)
Protein, ur: NEGATIVE mg/dL
SPECIFIC GRAVITY, URINE: 1.013 (ref 1.005–1.030)
UROBILINOGEN UA: 1 mg/dL (ref 0.0–1.0)

## 2014-04-27 LAB — URINE MICROSCOPIC-ADD ON

## 2014-04-27 LAB — WET PREP, GENITAL: Yeast Wet Prep HPF POC: NONE SEEN

## 2014-04-27 LAB — PREGNANCY, URINE: Preg Test, Ur: NEGATIVE

## 2014-04-27 MED ORDER — CEFTRIAXONE SODIUM 250 MG IJ SOLR
250.0000 mg | Freq: Once | INTRAMUSCULAR | Status: AC
  Administered 2014-04-27: 250 mg via INTRAMUSCULAR
  Filled 2014-04-27: qty 250

## 2014-04-27 MED ORDER — ONDANSETRON 8 MG PO TBDP
8.0000 mg | ORAL_TABLET | Freq: Once | ORAL | Status: AC
Start: 2014-04-27 — End: 2014-04-27
  Administered 2014-04-27: 8 mg via ORAL

## 2014-04-27 MED ORDER — AZITHROMYCIN 250 MG PO TABS
1000.0000 mg | ORAL_TABLET | Freq: Once | ORAL | Status: AC
Start: 2014-04-27 — End: 2014-04-27
  Administered 2014-04-27: 1000 mg via ORAL
  Filled 2014-04-27: qty 4

## 2014-04-27 MED ORDER — ONDANSETRON 8 MG PO TBDP
ORAL_TABLET | ORAL | Status: AC
  Administered 2014-04-27: 8 mg via ORAL
  Filled 2014-04-27: qty 1

## 2014-04-27 MED ORDER — METRONIDAZOLE 500 MG PO TABS
2000.0000 mg | ORAL_TABLET | Freq: Once | ORAL | Status: AC
  Administered 2014-04-27: 2000 mg via ORAL
  Filled 2014-04-27: qty 4

## 2014-04-27 NOTE — ED Notes (Signed)
EDPA holding blood draw at this time. Pending pelvic. Pt alert, NAD, calm, interactive, (denies: pain, sob, nausea, dizziness or other sx).

## 2014-04-27 NOTE — ED Notes (Signed)
Pt reports suprapubic/pelvic pain x 2 days, has had abnormal vaginal spotting.  Denies dysuria, n/v/d.  Went to pcp today and had blood and urine which were negative.

## 2014-04-27 NOTE — ED Notes (Signed)
[facility] VSS.

## 2014-04-27 NOTE — Discharge Instructions (Signed)
You were treated today for trichomonas infection which was found on her exam. You were also treated for possible gonorrhea and chlamydia, however cultures are pending and he will be called if they return abnormal in 2-3 days. Make sure.  partner is treated as well. Follow-up as needed if you continue to have pain.   Trichomoniasis Trichomoniasis is an infection caused by an organism called Trichomonas. The infection can affect both women and men. In women, the outer female genitalia and the vagina are affected. In men, the penis is mainly affected, but the prostate and other reproductive organs can also be involved. Trichomoniasis is a sexually transmitted infection (STI) and is most often passed to another person through sexual contact.  RISK FACTORS  Having unprotected sexual intercourse.  Having sexual intercourse with an infected partner. SIGNS AND SYMPTOMS  Symptoms of trichomoniasis in women include:  Abnormal gray-green frothy vaginal discharge.  Itching and irritation of the vagina.  Itching and irritation of the area outside the vagina. Symptoms of trichomoniasis in men include:   Penile discharge with or without pain.  Pain during urination. This results from inflammation of the urethra. DIAGNOSIS  Trichomoniasis may be found during a Pap test or physical exam. Your health care provider may use one of the following methods to help diagnose this infection:  Examining vaginal discharge under a microscope. For men, urethral discharge would be examined.  Testing the pH of the vagina with a test tape.  Using a vaginal swab test that checks for the Trichomonas organism. A test is available that provides results within a few minutes.  Doing a culture test for the organism. This is not usually needed. TREATMENT   You may be given medicine to fight the infection. Women should inform their health care provider if they could be or are pregnant. Some medicines used to treat the  infection should not be taken during pregnancy.  Your health care provider may recommend over-the-counter medicines or creams to decrease itching or irritation.  Your sexual partner will need to be treated if infected. HOME CARE INSTRUCTIONS   Take medicines only as directed by your health care provider.  Take over-the-counter medicine for itching or irritation as directed by your health care provider.  Do not have sexual intercourse while you have the infection.  Women should not douche or wear tampons while they have the infection.  Discuss your infection with your partner. Your partner may have gotten the infection from you, or you may have gotten it from your partner.  Have your sex partner get examined and treated if necessary.  Practice safe, informed, and protected sex.  See your health care provider for other STI testing. SEEK MEDICAL CARE IF:   You still have symptoms after you finish your medicine.  You develop abdominal pain.  You have pain when you urinate.  You have bleeding after sexual intercourse.  You develop a rash.  Your medicine makes you sick or makes you throw up (vomit). MAKE SURE YOU:  Understand these instructions.  Will watch your condition.  Will get help right away if you are not doing well or get worse. Document Released: 07/06/2000 Document Revised: 05/27/2013 Document Reviewed: 10/22/2012 [facility] Patient Information 2015 [place], [place]. This information is not intended to replace advice given to you by your health care provider. Make sure you discuss any questions you have with your health care provider.

## 2014-04-27 NOTE — ED Provider Notes (Signed)
CSN: [id number]     Arrival date & time 04/27/14  1736 History   First MD Initiated Contact with Patient 04/27/14 1909     Chief Complaint  Patient presents with  . Pelvic Pain     (Consider location/radiation/quality/duration/timing/severity/associated sxs/prior Treatment) HPI Rebekah Reed is a 44 y.o. female with no medical problems presents to emergency department complaining of lower abdominal pain. Pain is suprapubic, comes and goes. Pain for 2 days. States feels like contractions. She denies pregnancy. She denies any urinary symptoms. States she has had some vaginal spotting, but states this is normal for her menstrual cycle to start at this time. She states pain is central, it's not more to one side than the other. It radiates to lower back. Nothing is making her pain better or worse. She went to her primary care doctor who sent her to emergency department for CT scan. She has ultrasound pelvis ordered of her Thursday, 4 days from today. She does not take any medications prior to coming in and does not want any in emergency department.  History reviewed. No pertinent past medical history. Past Surgical History  Procedure Laterality Date  . Tubal ligation     No family history on file. History  Substance Use Topics  . Smoking status: Never Smoker   . Smokeless tobacco: Not on file  . Alcohol Use: No   OB History    No data available     Review of Systems  Constitutional: Negative for fever and chills.  Respiratory: Negative for cough, chest tightness and shortness of breath.   Cardiovascular: Negative for chest pain, palpitations and leg swelling.  Gastrointestinal: Positive for abdominal pain. Negative for nausea, vomiting and diarrhea.  Genitourinary: Positive for vaginal discharge and pelvic pain. Negative for dysuria, flank pain, vaginal bleeding and vaginal pain.  Musculoskeletal: Negative for myalgias, arthralgias, neck pain and neck stiffness.  Skin: Negative for  rash.  Neurological: Negative for dizziness, weakness and headaches.  All other systems reviewed and are negative.     Allergies  Penicillins  Home Medications   Prior to Admission medications   Medication Sig Start Date End Date Taking? Authorizing Provider  guaiFENesin-dextromethorphan (ROBITUSSIN DM) 100-10 MG/5ML syrup Take 5 mLs by mouth 3 (three) times daily as needed.    Historical Provider, MD  HYDROcodone-acetaminophen (NORCO/VICODIN) 5-325 MG per tablet Take 1 tablet by mouth every 6 (six) hours as needed for pain. 11/05/11   Gerhard Munchobert Lockwood, MD  ibuprofen (ADVIL,MOTRIN) 800 MG tablet Take 1 tablet (800 mg total) by mouth every 8 (eight) hours as needed for mild pain. 03/01/14   Layla MawKristen N Ward, DO  norgestimate-ethinyl estradiol (SPRINTEC 28) 0.25-35 MG-MCG tablet Take 1 tablet by mouth daily. 03/01/14   Kristen N Ward, DO  omeprazole (PRILOSEC) 20 MG capsule Take 1 capsule (20 mg total) by mouth daily. 02/27/12   Gerhard Munchobert Lockwood, MD   BP 118/73 mmHg  Pulse 66  Temp(Src) 98.4 F (36.9 C) (Oral)  Resp 18  Ht 5\' 3"  (1.6 m)  Wt 218 lb (98.884 kg)  BMI 38.63 kg/m2  SpO2 97% Physical Exam  Constitutional: She appears well-developed and well-nourished. No distress.  HENT:  Head: Normocephalic.  Eyes: Conjunctivae are normal.  Neck: Neck supple.  Cardiovascular: Normal rate, regular rhythm and normal heart sounds.   Pulmonary/Chest: Effort normal and breath sounds normal. No respiratory distress. She has no wheezes. She has no rales.  Abdominal: Soft. Bowel sounds are normal. She exhibits no distension. There is  tenderness. There is no rebound and no guarding.  Suprapubic tenderness  Genitourinary:  Normal external genitalia. Normal vaginal canal. Brown frothy vaginal discharge. Cervix is erythematous, mild cervical motion tenderness.. No uterine or adnexal tenderness. No masses palpated.    Musculoskeletal: She exhibits no edema.  Neurological: She is alert.  Skin: Skin is  warm and dry.  Psychiatric: She has a normal mood and affect. Her behavior is normal.  Nursing note and vitals reviewed.   ED Course  Procedures (including critical care time) Labs Review Labs Reviewed  WET PREP, GENITAL - Abnormal; Notable for the following:    Trich, Wet Prep FEW (*)    Clue Cells Wet Prep HPF POC FEW (*)    WBC, Wet Prep HPF POC MODERATE (*)    All other components within normal limits  URINALYSIS, ROUTINE W REFLEX MICROSCOPIC - Abnormal; Notable for the following:    Hgb urine dipstick LARGE (*)    All other components within normal limits  URINE MICROSCOPIC-ADD ON - Abnormal; Notable for the following:    Bacteria, UA MANY (*)    All other components within normal limits  PREGNANCY, URINE  GC/CHLAMYDIA PROBE AMP ()    Imaging Review No results found.   EKG Interpretation None      MDM   Final diagnoses:  Trichomonal cervicitis    Patient with pelvic pain that comes and goes, states feels like contractions. Urine pregnancy is negative. White count a primary care doctor's office is 6.8. She has no guarding or rebound tenderness here in emergency department. She does have suprapubic tenderness. He has bacteria, patient denies any urinary symptoms. Pelvic exam shows frothy discharge with erythematous cervix. Wet prep is positive for trichomonas. I discussed this patient with Dr. Rennis Chris seen her. We do not think she needs CT scan of her abdomen or pelvis at this time. We'll see for cervicitis. Follow-up with her primary care doctor for her ultrasound. Return precautions discussed.   Filed Vitals:   04/27/14 2030 04/27/14 2045 04/27/14 2100 04/27/14 2115  BP: 109/65 109/65 102/85 118/73  Pulse: 68 64 96 66  Temp:      TempSrc:      Resp:      Height:      Weight:      SpO2: 96% 96% 99% 97%      Jaynie Crumble, PA-C 04/28/14 0054  Doug Sou, MD 04/30/14 1725

## 2014-04-27 NOTE — ED Provider Notes (Addendum)
ComPlains of suprapubic pain, nonradiating onset 2 days ago. Waxes and wanes. No anorexia no fever no nausea or vomiting. Other associated symptoms include vaginal spotting. On exam no distress lungs clear auscultation abdomen obese, mildly tender at suprapubic area no guarding rigidity or rebound  Doug SouSam Timara Loma, MD 04/27/14 2127  Doug SouSam Ahlivia Salahuddin, MD 04/30/14 1725

## 2014-04-28 LAB — URINE CULTURE
COLONY COUNT: NO GROWTH
CULTURE: NO GROWTH

## 2014-04-28 LAB — GC/CHLAMYDIA PROBE AMP (~~LOC~~) NOT AT ARMC
CHLAMYDIA, DNA PROBE: NEGATIVE
Neisseria Gonorrhea: NEGATIVE

## 2014-11-03 ENCOUNTER — Encounter (HOSPITAL_COMMUNITY): Payer: Self-pay

## 2014-11-14 ENCOUNTER — Ambulatory Visit (HOSPITAL_COMMUNITY): Payer: 59 | Admitting: Anesthesiology

## 2014-11-14 ENCOUNTER — Ambulatory Visit (HOSPITAL_COMMUNITY)
Admission: RE | Admit: 2014-11-14 | Discharge: 2014-11-14 | Disposition: A | Discharge status: Home / Self Care | Payer: 59 | Source: Ambulatory Visit | Attending: Obstetrics and Gynecology | Admitting: Obstetrics and Gynecology

## 2014-11-14 ENCOUNTER — Encounter (HOSPITAL_COMMUNITY)
Admission: RE | Disposition: A | Discharge status: Home / Self Care | Payer: Self-pay | Source: Ambulatory Visit | Attending: Obstetrics and Gynecology

## 2014-11-14 ENCOUNTER — Encounter (HOSPITAL_COMMUNITY): Payer: Self-pay

## 2014-11-14 DIAGNOSIS — N938 Other specified abnormal uterine and vaginal bleeding: Secondary | ICD-10-CM | POA: Diagnosis not present

## 2014-11-14 DIAGNOSIS — Z87891 Personal history of nicotine dependence: Secondary | ICD-10-CM | POA: Diagnosis not present

## 2014-11-14 DIAGNOSIS — N921 Excessive and frequent menstruation with irregular cycle: Secondary | ICD-10-CM | POA: Insufficient documentation

## 2014-11-14 DIAGNOSIS — Z30432 Encounter for removal of intrauterine contraceptive device: Secondary | ICD-10-CM | POA: Diagnosis not present

## 2014-11-14 HISTORY — DX: Cardiac murmur, unspecified: R01.1

## 2014-11-14 HISTORY — PX: IUD REMOVAL: SHX5392

## 2014-11-14 HISTORY — PX: HYSTEROSCOPY: SHX211

## 2014-11-14 LAB — CBC
HEMATOCRIT: 39.5 % (ref 36.0–46.0)
HEMOGLOBIN: 13 g/dL (ref 12.0–15.0)
MCH: 28.4 pg (ref 26.0–34.0)
MCHC: 32.9 g/dL (ref 30.0–36.0)
MCV: 86.2 fL (ref 78.0–100.0)
Platelets: 173 10*3/uL (ref 150–400)
RBC: 4.58 MIL/uL (ref 3.87–5.11)
RDW: 14.8 % (ref 11.5–15.5)
WBC: 4.2 10*3/uL (ref 4.0–10.5)

## 2014-11-14 LAB — PREGNANCY, URINE: Preg Test, Ur: NEGATIVE

## 2014-11-14 SURGERY — ABLATION, ENDOMETRIUM, HYSTEROSCOPIC
Anesthesia: General

## 2014-11-14 MED ORDER — FENTANYL CITRATE (PF) 100 MCG/2ML IJ SOLN
INTRAMUSCULAR | Status: DC | PRN
  Administered 2014-11-14 (×2): 50 ug via INTRAVENOUS

## 2014-11-14 MED ORDER — LIDOCAINE HCL (CARDIAC) 20 MG/ML IV SOLN
INTRAVENOUS | Status: DC | PRN
  Administered 2014-11-14: 100 mg via INTRAVENOUS

## 2014-11-14 MED ORDER — MIDAZOLAM HCL 2 MG/2ML IJ SOLN
INTRAMUSCULAR | Status: AC
  Filled 2014-11-14: qty 4

## 2014-11-14 MED ORDER — FLUMAZENIL 0.5 MG/5ML IV SOLN
INTRAVENOUS | Status: AC
  Filled 2014-11-14: qty 5

## 2014-11-14 MED ORDER — IBUPROFEN 800 MG PO TABS: 800.0000 mg | ORAL_TABLET | Freq: Three times a day (TID) | ORAL | Status: DC | PRN

## 2014-11-14 MED ORDER — LIDOCAINE HCL 2 % IJ SOLN
INTRAMUSCULAR | Status: AC
  Filled 2014-11-14: qty 20

## 2014-11-14 MED ORDER — SCOPOLAMINE 1 MG/3DAYS TD PT72
MEDICATED_PATCH | TRANSDERMAL | Status: AC
  Administered 2014-11-14: 1.5 mg via TRANSDERMAL
  Filled 2014-11-14: qty 1

## 2014-11-14 MED ORDER — KETOROLAC TROMETHAMINE 30 MG/ML IJ SOLN
INTRAMUSCULAR | Status: DC | PRN
  Administered 2014-11-14: 30 mg via INTRAVENOUS

## 2014-11-14 MED ORDER — ONDANSETRON HCL 4 MG/2ML IJ SOLN
INTRAMUSCULAR | Status: DC | PRN
  Administered 2014-11-14: 4 mg via INTRAVENOUS

## 2014-11-14 MED ORDER — OXYCODONE-ACETAMINOPHEN 5-325 MG PO TABS
ORAL_TABLET | ORAL | Status: AC
  Filled 2014-11-14: qty 1

## 2014-11-14 MED ORDER — ATROPINE SULFATE 0.4 MG/ML IJ SOLN
INTRAMUSCULAR | Status: AC
  Filled 2014-11-14: qty 2

## 2014-11-14 MED ORDER — IBUPROFEN 800 MG PO TABS
800.0000 mg | ORAL_TABLET | Freq: Once | ORAL | Status: AC
  Administered 2014-11-14: 800 mg via ORAL

## 2014-11-14 MED ORDER — PROPOFOL 10 MG/ML IV BOLUS
INTRAVENOUS | Status: AC
  Filled 2014-11-14: qty 20

## 2014-11-14 MED ORDER — DEXAMETHASONE SODIUM PHOSPHATE 10 MG/ML IJ SOLN
INTRAMUSCULAR | Status: DC | PRN
  Administered 2014-11-14: 4 mg via INTRAVENOUS

## 2014-11-14 MED ORDER — LIDOCAINE HCL 2 % IJ SOLN
INTRAMUSCULAR | Status: DC | PRN
  Administered 2014-11-14: 20 mL

## 2014-11-14 MED ORDER — FENTANYL CITRATE (PF) 100 MCG/2ML IJ SOLN
INTRAMUSCULAR | Status: AC
  Filled 2014-11-14: qty 4

## 2014-11-14 MED ORDER — LIDOCAINE HCL (CARDIAC) 20 MG/ML IV SOLN
INTRAVENOUS | Status: AC
  Filled 2014-11-14: qty 5

## 2014-11-14 MED ORDER — SCOPOLAMINE 1 MG/3DAYS TD PT72
1.0000 | MEDICATED_PATCH | Freq: Once | TRANSDERMAL | Status: DC
  Administered 2014-11-14: 1.5 mg via TRANSDERMAL

## 2014-11-14 MED ORDER — OXYCODONE-ACETAMINOPHEN 5-325 MG PO TABS: 2.0000 | ORAL_TABLET | ORAL | Status: DC | PRN

## 2014-11-14 MED ORDER — METOCLOPRAMIDE HCL 5 MG/ML IJ SOLN: 10.0000 mg | Freq: Once | INTRAMUSCULAR | Status: DC | PRN

## 2014-11-14 MED ORDER — ONDANSETRON HCL 4 MG/2ML IJ SOLN
INTRAMUSCULAR | Status: AC
  Filled 2014-11-14: qty 2

## 2014-11-14 MED ORDER — FENTANYL CITRATE (PF) 100 MCG/2ML IJ SOLN
INTRAMUSCULAR | Status: AC
  Filled 2014-11-14: qty 2

## 2014-11-14 MED ORDER — MIDAZOLAM HCL 2 MG/2ML IJ SOLN
INTRAMUSCULAR | Status: DC | PRN
  Administered 2014-11-14: 2 mg via INTRAVENOUS

## 2014-11-14 MED ORDER — OXYCODONE-ACETAMINOPHEN 5-325 MG PO TABS
1.0000 | ORAL_TABLET | Freq: Once | ORAL | Status: AC
  Administered 2014-11-14: 1 via ORAL

## 2014-11-14 MED ORDER — MEPERIDINE HCL 25 MG/ML IJ SOLN: 6.2500 mg | INTRAMUSCULAR | Status: DC | PRN

## 2014-11-14 MED ORDER — PROPOFOL 10 MG/ML IV BOLUS
INTRAVENOUS | Status: DC | PRN
  Administered 2014-11-14: 180 mg via INTRAVENOUS

## 2014-11-14 MED ORDER — SILVER NITRATE-POT NITRATE 75-25 % EX MISC
CUTANEOUS | Status: DC | PRN
  Administered 2014-11-14: 2

## 2014-11-14 MED ORDER — FENTANYL CITRATE (PF) 100 MCG/2ML IJ SOLN
25.0000 ug | INTRAMUSCULAR | Status: DC | PRN
  Administered 2014-11-14 (×2): 50 ug via INTRAVENOUS

## 2014-11-14 MED ORDER — DEXAMETHASONE SODIUM PHOSPHATE 4 MG/ML IJ SOLN
INTRAMUSCULAR | Status: AC
  Filled 2014-11-14: qty 1

## 2014-11-14 MED ORDER — IBUPROFEN 800 MG PO TABS
ORAL_TABLET | ORAL | Status: AC
  Filled 2014-11-14: qty 1

## 2014-11-14 MED ORDER — KETOROLAC TROMETHAMINE 30 MG/ML IJ SOLN
INTRAMUSCULAR | Status: AC
Start: 2014-11-14 — End: 2014-11-14
  Filled 2014-11-14: qty 1

## 2014-11-14 MED ORDER — LACTATED RINGERS IV SOLN
INTRAVENOUS | Status: DC
  Administered 2014-11-14: 10 mL/h via INTRAVENOUS
  Administered 2014-11-14: 12:00:00 via INTRAVENOUS

## 2014-11-14 MED ORDER — HYDROCODONE-ACETAMINOPHEN 7.5-325 MG PO TABS
1.0000 | ORAL_TABLET | Freq: Once | ORAL | Status: DC | PRN
Start: 2014-11-14 — End: 2014-11-14

## 2014-11-14 SURGICAL SUPPLY — 14 items
CANISTER SUCT 3000ML (MISCELLANEOUS) ×3 IMPLANT
CATH ROBINSON RED A/P 16FR (CATHETERS) ×3 IMPLANT
CLOTH BEACON ORANGE TIMEOUT ST (SAFETY) ×3 IMPLANT
CONTAINER PREFILL 10% NBF 60ML (FORM) ×6 IMPLANT
DILATOR CANAL MILEX (MISCELLANEOUS) IMPLANT
GLOVE BIO SURGEON STRL SZ7 (GLOVE) ×3 IMPLANT
GLOVE BIOGEL PI IND STRL 7.0 (GLOVE) ×2 IMPLANT
GLOVE BIOGEL PI INDICATOR 7.0 (GLOVE) ×1
GOWN STRL REUS W/TWL LRG LVL3 (GOWN DISPOSABLE) ×6 IMPLANT
PACK VAGINAL MINOR WOMEN LF (CUSTOM PROCEDURE TRAY) ×3 IMPLANT
PAD OB MATERNITY 4.3X12.25 (PERSONAL CARE ITEMS) ×6 IMPLANT
SET GENESYS HTA PROCERVA (MISCELLANEOUS) ×3 IMPLANT
TOWEL OR 17X24 6PK STRL BLUE (TOWEL DISPOSABLE) ×6 IMPLANT
WATER STERILE IRR 1000ML POUR (IV SOLUTION) ×3 IMPLANT

## 2014-11-14 NOTE — Brief Op Note (Signed)
11/14/2014  12:10 PM  PATIENT:  Rebekah Reed  44 y.o. female  PRE-OPERATIVE DIAGNOSIS:  N93.8 Dysfunctional Uterine Bleeding, Mirena IUD in place  POST-OPERATIVE DIAGNOSIS:  same  PROCEDURE:  Procedure(s): HYSTEROSCOPY WITH HYDROTHERMAL ABLATION (N/A) INTRAUTERINE DEVICE (IUD) REMOVAL D&C  SURGEON:  Surgeon(s) and Role:    * Geryl RankinsEvelyn Aedon Deason, MD - Primary  PHYSICIAN ASSISTANT:   ASSISTANTS: none   ANESTHESIA:   paracervical block and ?LMA  EBL:  Total I/O In: 1000 [I.V.:1000] Out: 30 [Urine:30]  BLOOD ADMINISTERED:none  DRAINS: none   LOCAL MEDICATIONS USED: 2% LIDOCAINE  and Amount: 10 ml  SPECIMEN:  Source of Specimen:  endometrial currettings  DISPOSITION OF SPECIMEN:  PATHOLOGY  COUNTS:  YES  TOURNIQUET:  * No tourniquets in log *  DICTATION: .Other Dictation: Dictation Number [id number]  PLAN OF CARE: Discharge to home after PACU  PATIENT DISPOSITION:  PACU - hemodynamically stable.   Delay start of Pharmacological VTE agent (>24hrs) due to surgical blood loss or risk of bleeding: yes

## 2014-11-14 NOTE — Anesthesia Postprocedure Evaluation (Signed)
Anesthesia Post Note  Patient: Rebekah Reed A Morrison  Procedure(s) Performed: Procedure(s) (LRB): HYSTEROSCOPY WITH HYDROTHERMAL ABLATION (N/A) INTRAUTERINE DEVICE (IUD) REMOVAL  Anesthesia type: General  Patient location: PACU  Post pain: Pain level controlled  Post assessment: Post-op Vital signs reviewed  Last Vitals:  Filed Vitals:   11/14/14 1300  BP: 109/60  Pulse:   Temp:   Resp:     Post vital signs: Reviewed  Level of consciousness: sedated  Complications: No apparent anesthesia complications

## 2014-11-14 NOTE — Op Note (Signed)
NAMEJANE, Reed NO.:  [id number]  MEDICAL RECORD NO.:  [id number]  LOCATION:  [facility]                          [facility]ACILITY:  [facility]  PHYSICIAN:  Pieter Partridge, MD   DATE OF BIRTH:  08/28/1970  DATE OF PROCEDURE: DATE OF DISCHARGE:  11/14/2014                              OPERATIVE REPORT   PREOPERATIVE DIAGNOSIS:  Dysfunctional uterine bleeding.  Mirena IUD in place.  POSTOPERATIVE DIAGNOSIS:  Dysfunctional uterine bleeding.  Mirena IUD in place.  PROCEDURE:  IUD removal.  Hysteroscopy with hydrothermal ablation and dilation and curettage.  SURGEON:  Pieter Partridge, MD.  ASSISTANT:  None.  ANESTHESIA:  Paracervical block and general anesthesia.  ESTIMATED BLOOD LOSS:  Minimal.  URINE OUTPUT:  30 prior to procedure.  DRAINS:  None.  LOCAL SEDATION:  A 2% lidocaine 10 mL.  SPECIMEN:  Endometrial curettings to Pathology.  DISPOSITION:  The patient disposition to PACU hemodynamically stable.  COMPLICATIONS:  None.  FINDINGS:  Normal appearing endometrial cavity with a lot of normal- appearing endometrial tissue and also clot.  Cervix was normal.  PROCEDURE IN DETAIL:  The patient was identified in the holding area. She was then taken to the operating room with IV running.  She underwent general anesthesia without complication.  She was then placed in the dorsal lithotomy position and prepped and draped in a normal sterile fashion.  A time-out was taken.  Graves speculum was inserted into the vagina.  The anterior lip of the cervix was identified and injected with 2% lidocaine.  A single-tooth tenaculum was then applied.  A paracervical block was performed paracervically.  The cervix was then easily dilated up to a 22.  The hysteroscope was then advanced into the endometrial cavity, and there appeared to be a lot of tissue.  So, I opted to perform a D and C before to get rid of some of that tissue before started the procedure.  D and C with  sharp curettage was performed of all 4 quadrants, and I got quite a bit of tissue that was collected to send to Pathology.  The hysteroscope was then advanced to the middle of the uterine cavity. The HCA ablation device was activated, and fluid warmed appropriately, and the ablation continued until we got an error message at approximately 4 minutes that said that there was excessive leakage of fluid and tissue obstruction considered.  I was not able to see while the ablation was being performed non-suspect because of the endometrial tissue that the patient's uterus contained.  I shook the scope, and we started the device again and then continued, completed the ablation without alert.  Cool down was appropriate.  I took out the scope and cleaned it off.  There was quite a bit of tissue.  I then advanced and did take out some of the tissue.  I performed another D and C to get some of the extra tissue out.  The fundus of the uterus did not appear as wide as I would have hoped but it did appear that it had been treated once I have viewed the before and after pictures.  The hysteroscope was then removed from the vagina.  The single-tooth tenaculum was removed from the anterior lip of the cervix, and the puncture sites were made hemostatic with silver nitrate.  All instruments were then removed from the vagina.  The patient tolerated the procedure well.  She was taken to the recovery room in stable condition.  She had SCDs on throughout the entire procedure.  She did not have any preop antibiotics.     Pieter PartridgeEvelyn B Mikylah Ackroyd, MD     EBV/MEDQ  D:  11/14/2014  T:  11/14/2014  Job:  [id number]

## 2014-11-14 NOTE — Anesthesia Preprocedure Evaluation (Addendum)
Anesthesia Evaluation  Patient identified by MRN, date of birth, ID band Patient awake    Reviewed: Allergy & Precautions, NPO status , Patient's Chart, lab work & pertinent test results  Airway Mallampati: III  TM Distance: >3 FB Neck ROM: Full    Dental no notable dental hx. (+) Chipped,    Pulmonary neg pulmonary ROS,    Pulmonary exam normal breath sounds clear to auscultation       Cardiovascular negative cardio ROS   Rhythm:Regular Rate:Normal + Systolic murmurs    Neuro/Psych negative neurological ROS  negative psych ROS   GI/Hepatic negative GI ROS, Neg liver ROS,   Endo/Other  Obesity  Renal/GU negative Renal ROS  negative genitourinary   Musculoskeletal negative musculoskeletal ROS (+)   Abdominal (+) + obese,   Peds  Hematology negative hematology ROS (+)   Anesthesia Other Findings   Reproductive/Obstetrics DUB                            Anesthesia Physical Anesthesia Plan  ASA: II  Anesthesia Plan: General   Post-op Pain Management:    Induction: Intravenous  Airway Management Planned: LMA  Additional Equipment:   Intra-op Plan:   Post-operative Plan: Extubation in OR  Informed Consent: I have reviewed the patients History and Physical, chart, labs and discussed the procedure including the risks, benefits and alternatives for the proposed anesthesia with the patient or authorized representative who has indicated his/her understanding and acceptance.   Dental advisory given  Plan Discussed with: CRNA, Anesthesiologist and Surgeon  Anesthesia Plan Comments:         Anesthesia Quick Evaluation

## 2014-11-14 NOTE — Discharge Instructions (Signed)

## 2014-11-14 NOTE — Transfer of Care (Signed)
Immediate Anesthesia Transfer of Care Note  Patient: Rebekah Reed  Procedure(s) Performed: Procedure(s): HYSTEROSCOPY WITH HYDROTHERMAL ABLATION (N/A) INTRAUTERINE DEVICE (IUD) REMOVAL  Patient Location: PACU  Anesthesia Type:General  Level of Consciousness: awake, alert  and oriented  Airway & Oxygen Therapy: Patient Spontanous Breathing and Patient connected to nasal cannula oxygen  Post-op Assessment: Report given to RN, Post -op Vital signs reviewed and stable and Patient moving all extremities  Post vital signs: Reviewed and stable  Last Vitals:  Filed Vitals:   11/14/14 1013  BP: 105/60  Pulse: 60  Temp: 36.8 C  Resp: 18    Complications: No apparent anesthesia complications

## 2014-11-14 NOTE — H&P (Signed)
   Reason for Appointment  1. PreOp for 11/14/2014   History of Present Illness  General:  Pt presents for Hysteroscopy/D&C, HTA ablation Pt has Mirena in place. Spotting daily if not working. Pt is taking Provera for abnormal bleeding. Pt denies hot flashes. Pt was very cold natured has warmed up slightly.   Current Medications  Taking   No OTC meds   Mirena(Levonorgestrel) 20 MCG/24HR Intrauterine Device   Provera(Medroxyprogesterone) 10 MG Tablet 1 table One tablet daiy   Medication List reviewed and reconciled with the patient    Past Medical History  No chronic health issues  Keloid   Surgical History  tubal ligation    Family History  Father: deceased 4274 yrs, diagnosed with Lung Ca, Colon Ca  Mother: alive  Paternal Grand Father: deceased  Paternal Grand Mother: deceased  Maternal Grand Father: deceased  Maternal Grand Mother: deceased, diagnosed with DM  1 brother(s) , 1 sister(s) .   denies any GYN family cancer hx.   Social History  General:  Tobacco use  cigarettes: Former smoker Quit in year 2001 Tobacco history last updated 11/05/2014 no EXPOSURE TO PASSIVE SMOKE.  no Alcohol, never.  Caffeine: 2+ servings daily, coffee.  no Recreational drug use.  DIET: no particular dietary program.  Exercise: nothing structured.  Marital Status: Divorced.  Children: 2, girls, 1, Boys.  EDUCATION: High School.  OCCUPATION: [employer].    Gyn History  Sexual activity currently sexually active.  Periods : irregular since having IUD placed.  LMP off and on since IUD was placed in June 2016.  Birth control BTL, Mirena IUD.  Last pap smear date 04/16/14, negative pap and HPV.  Last mammogram date 03/2014.  Abnormal pap smear 2001 while pregnant and repeated after giving birth and was normal.  STD TRICH.    OB History  Number of pregnancies 3.  Pregnancy # 1 live birth, vaginal delivery.  Pregnancy # 2 live birth, vaginal delivery.  Pregnancy # 3 live birth, vaginal  delivery.    Allergies  Penicillin   Hospitalization/Major Diagnostic Procedure  childbirth x 3 1991/94/2001   Review of Systems  Denies fever/chills, chest pain, SOB, headaches, numbness/tingling. No h/o complication with anesthesia, bleeding disorders or blood clots.   Vital Signs  Wt 204, Wt change -1 lb, Ht 63, BMI 36.13, Pulse sitting 71, BP sitting 116/67.   Physical Examination  GENERAL:  Patient appears alert and oriented.  General Appearance: well-appearing, well-developed, no acute distress.  Speech: clear.  LUNGS:  Auscultation: no wheezing/rhonchi/rales. CTA bilaterally.  HEART:  Heart sounds: normal. RRR. no murmur.  ABDOMEN:  General: soft nontender, nondistended, no masses.  FEMALE GENITOURINARY:  Pelvic Not examined.  EXTREMITIES:  General: No edema or calf tenderness.     Assessments   1. Pre-operative clearance - Z01.818 (Primary)   2. Menometrorrhagia - N92.1   Treatment  1. Pre-operative clearance  Notes: Discussed R/B/A of procedures. Pt informed that ablation may not cause amenorrhea. Pt understands that bleeding will likely decrease, if menses are irregular pt will continue to bleed irregularly. Pt declined hysterectomy.    Follow Up  2 Weeks post op

## 2014-11-14 NOTE — Interval H&P Note (Signed)
History and Physical Interval Note:  11/14/2014 11:16 AM  Rebekah Reed  has presented today for surgery, with the diagnosis of N93.8 Dysfunctional Uterine Bleeding  The various methods of treatment have been discussed with the patient and family. After consideration of risks, benefits and other options for treatment, the patient has consented to  Procedure(s): HYSTEROSCOPY WITH HYDROTHERMAL ABLATION (N/A) as a surgical intervention .  The patient's history has been reviewed, patient examined, no change in status, stable for surgery.  I have reviewed the patient's chart and labs.  Questions were answered to the patient's satisfaction.     Dion BodyVARNADO, Nery Kalisz

## 2014-11-14 NOTE — Anesthesia Procedure Notes (Signed)
Procedure Name: LMA Insertion Date/Time: 11/14/2014 11:25 AM Performed by: Elgie CongoMALINOVA, Cienna Dumais H Pre-anesthesia Checklist: Patient identified, Emergency Drugs available, Suction available and Patient being monitored Patient Re-evaluated:Patient Re-evaluated prior to inductionOxygen Delivery Method: Circle system utilized Preoxygenation: Pre-oxygenation with 100% oxygen Intubation Type: IV induction LMA: LMA inserted LMA Size: 4.0 Number of attempts: 1 Placement Confirmation: breath sounds checked- equal and bilateral and positive ETCO2 Tube secured with: Tape Dental Injury: Teeth and Oropharynx as per pre-operative assessment

## 2014-11-16 ENCOUNTER — Encounter (HOSPITAL_COMMUNITY): Payer: Self-pay | Admitting: Obstetrics and Gynecology

## 2015-01-16 ENCOUNTER — Other Ambulatory Visit (HOSPITAL_BASED_OUTPATIENT_CLINIC_OR_DEPARTMENT_OTHER): Payer: Self-pay | Admitting: Family Medicine

## 2015-01-16 ENCOUNTER — Ambulatory Visit (HOSPITAL_BASED_OUTPATIENT_CLINIC_OR_DEPARTMENT_OTHER)
Admission: RE | Admit: 2015-01-16 | Discharge: 2015-01-16 | Disposition: A | Discharge status: Home / Self Care | Payer: 59 | Source: Ambulatory Visit | Attending: Family Medicine | Admitting: Family Medicine

## 2015-01-16 DIAGNOSIS — R918 Other nonspecific abnormal finding of lung field: Secondary | ICD-10-CM | POA: Diagnosis not present

## 2015-01-16 DIAGNOSIS — R911 Solitary pulmonary nodule: Secondary | ICD-10-CM | POA: Diagnosis not present

## 2015-01-16 DIAGNOSIS — J8410 Pulmonary fibrosis, unspecified: Secondary | ICD-10-CM | POA: Diagnosis not present

## 2015-01-16 DIAGNOSIS — Z87891 Personal history of nicotine dependence: Secondary | ICD-10-CM | POA: Insufficient documentation

## 2015-01-16 DIAGNOSIS — R05 Cough: Secondary | ICD-10-CM | POA: Insufficient documentation

## 2016-05-04 IMAGING — CT CT CHEST W/O CM
2 of 3 series · 14 of 36 positions shown, 17 images · non-contrast
Comparison: None; correlation chest radiograph 01/13/2015

CLINICAL DATA: Abnormal chest radiograph with single pulmonary
nodule, nonproductive cough for 1.5 weeks, former smoker

EXAM:
CT CHEST WITHOUT CONTRAST
TECHNIQUE: Multidetector CT imaging of the chest was performed following the
standard protocol without IV contrast. Sagittal and coronal MPR
images reconstructed from axial data set.

[Series 2: thorax · axial · 0.79mm/px · z∈[-217,-12]mm · 11 of 49 slices shown, 14 images]
[im 4/49  mediastinal]
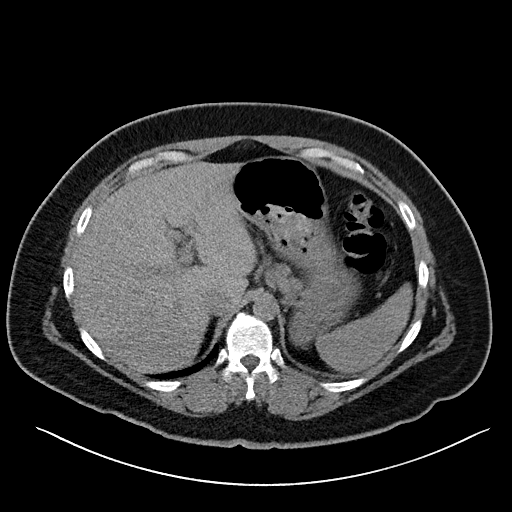
[im 4/49  lung]
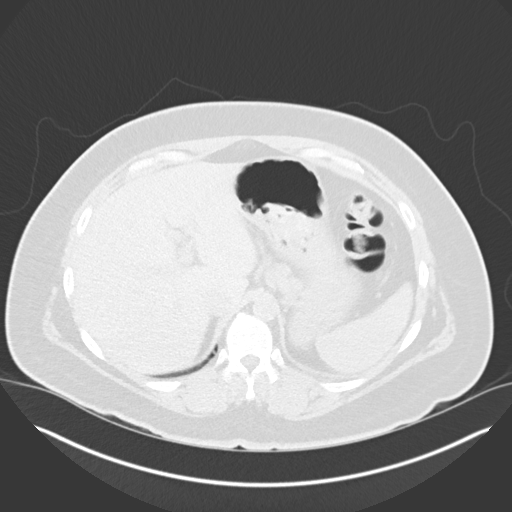
[im 8/49  lung]
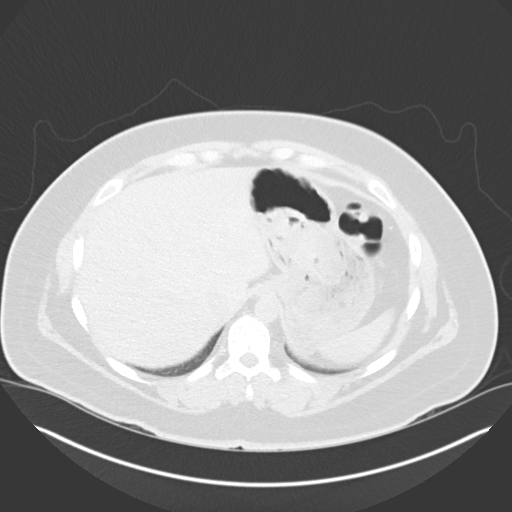
[im 11/49  lung]
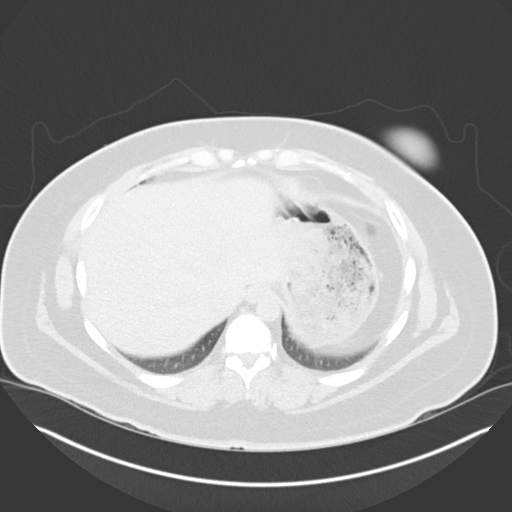
[im 17/49  lung]
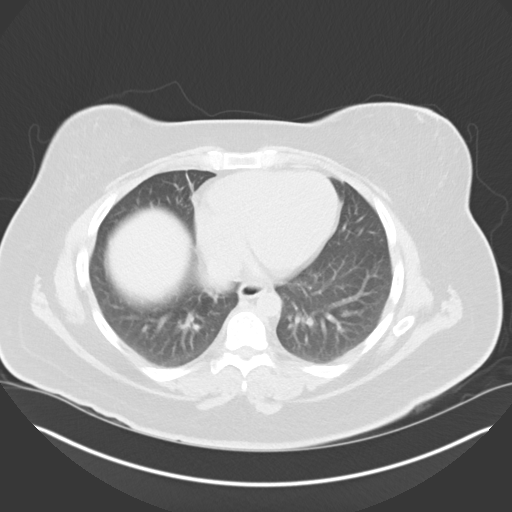
[im 20/49  mediastinal]
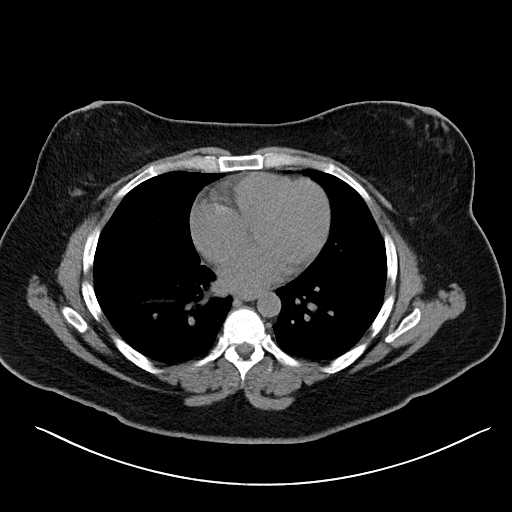
[im 20/49  lung]
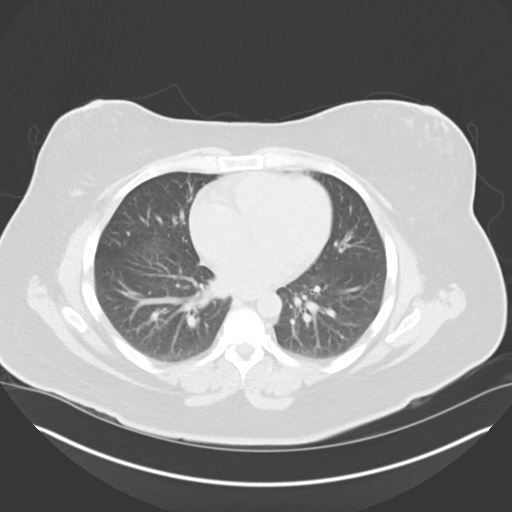
[im 25/49  lung]
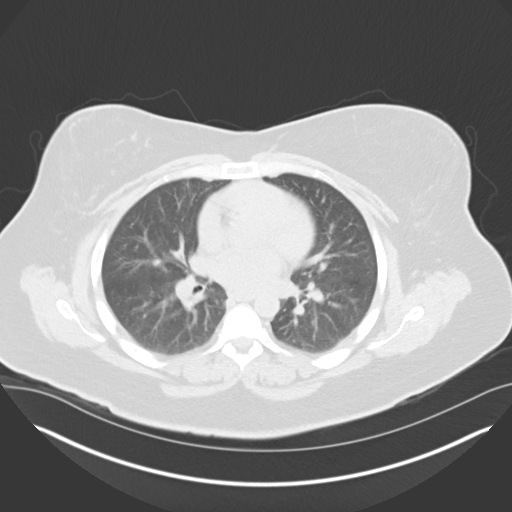
[im 29/49  lung]
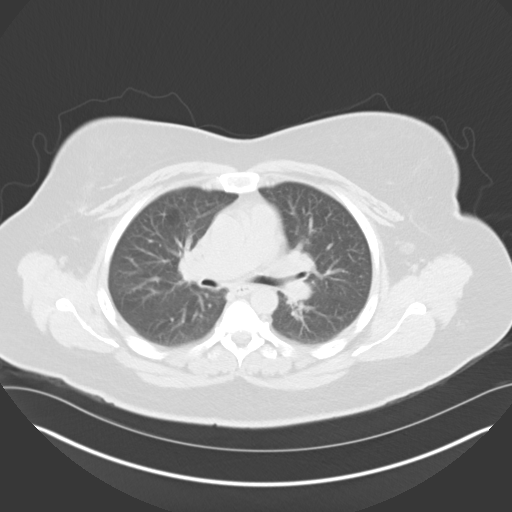
[im 33/49  lung]
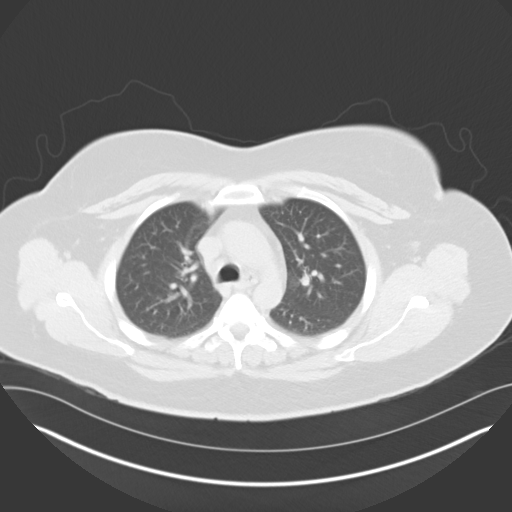
[im 38/49  mediastinal]
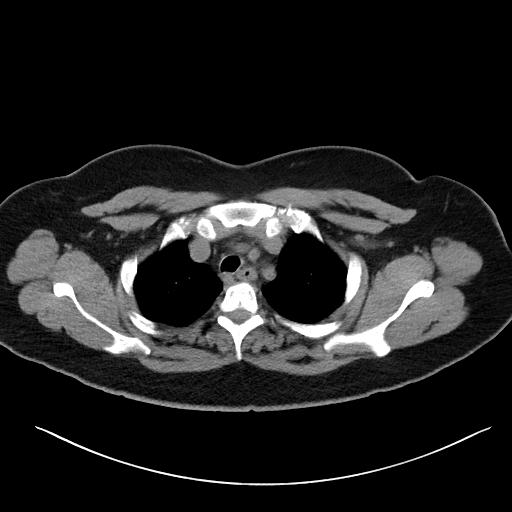
[im 38/49  lung]
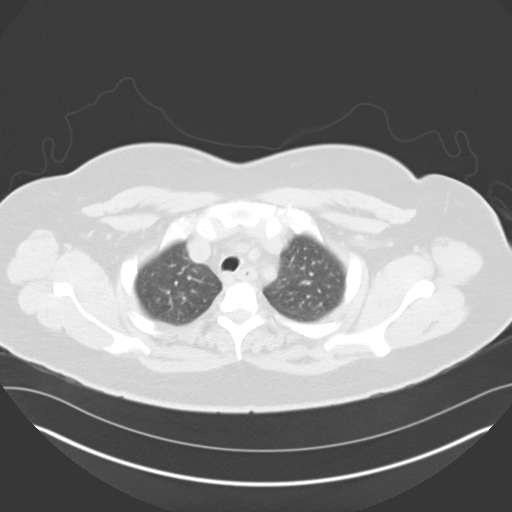
[im 41/49  lung]
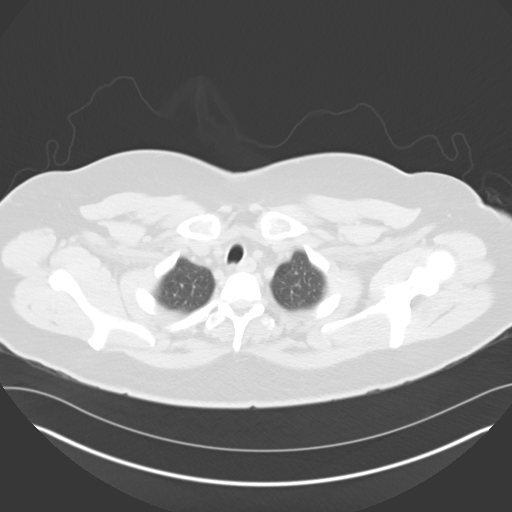
[im 45/49  lung]
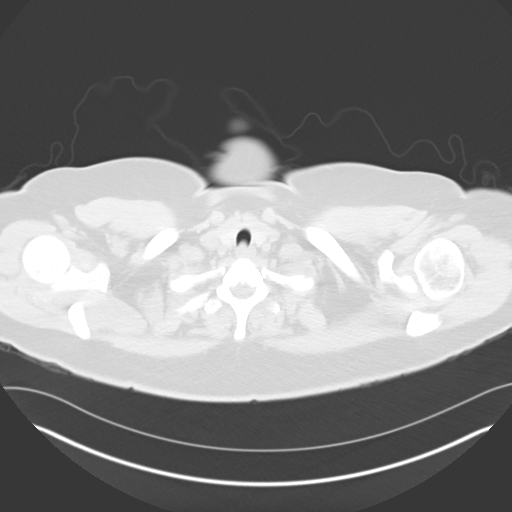

[Series 5: coronal · coronal · 0.53mm/px · 3 of 118 slices shown]
[im 24/118  lung]
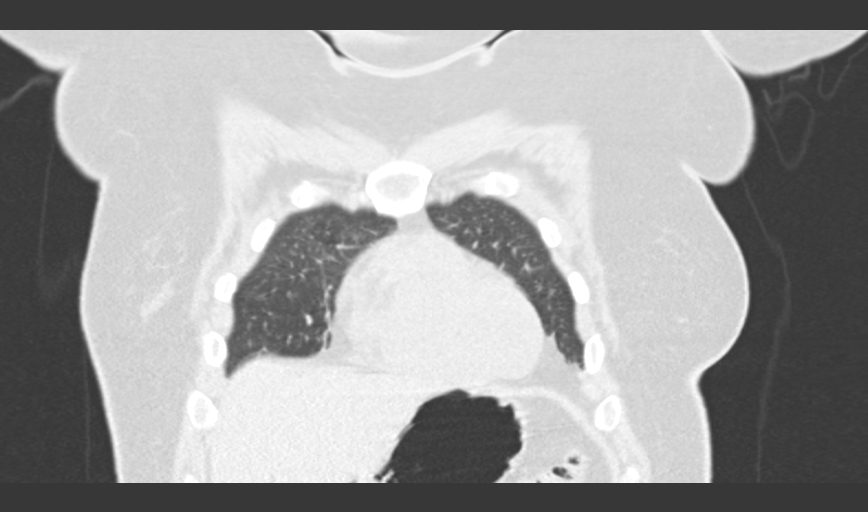
[im 47/118  lung]
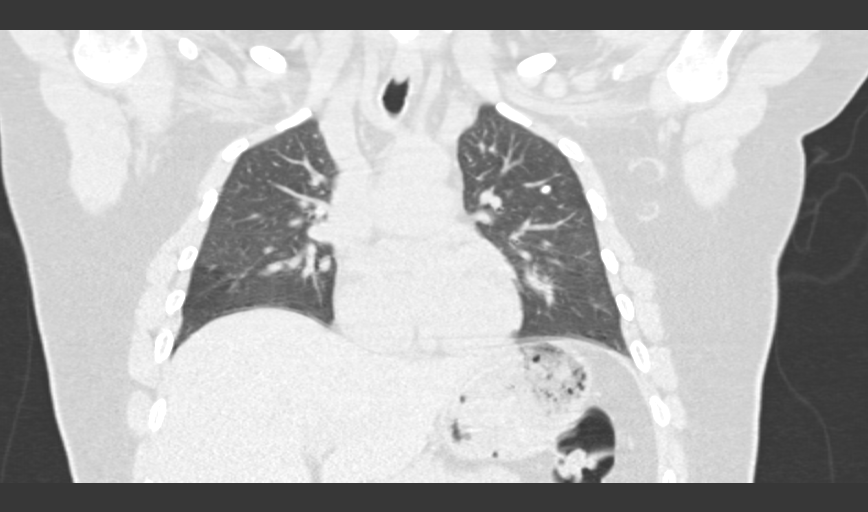
[im 71/118  lung]
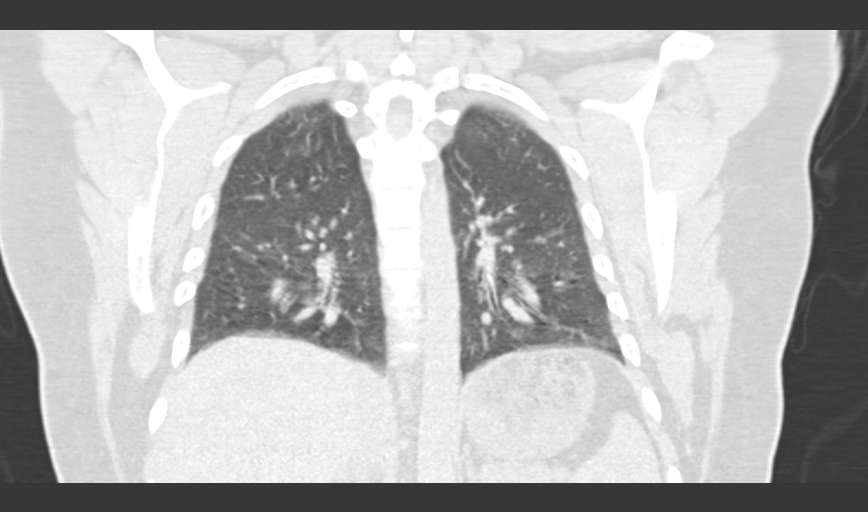

[14 of 36 positions shown; findings below may reference images not displayed]

FINDINGS: Aorta normal caliber.

No thoracic adenopathy.

Visualized portion of upper abdomen normal appearance.

Osseous structures normal appearance.

Densely calcified 5 mm diameter granuloma LEFT upper lobe
corresponding to radiographic abnormality.

Linear subsegmental atelectasis versus scarring in medial RIGHT
middle lobe.

Remaining lungs clear.

No pulmonary infiltrate, pleural effusion or pneumothorax.
IMPRESSION: Densely calcified 5 mm pulmonary granuloma LEFT upper lobe
accounting for radiograph finding.

Minimal linear subsegmental atelectasis versus scarring in medial
RIGHT middle lobe anteriorly.

Otherwise negative exam.

## 2016-05-24 ENCOUNTER — Other Ambulatory Visit (HOSPITAL_COMMUNITY): Payer: Self-pay | Admitting: Physician Assistant

## 2016-05-24 DIAGNOSIS — R011 Cardiac murmur, unspecified: Secondary | ICD-10-CM

## 2016-06-06 ENCOUNTER — Encounter (INDEPENDENT_AMBULATORY_CARE_PROVIDER_SITE_OTHER): Payer: Self-pay

## 2016-06-06 ENCOUNTER — Ambulatory Visit (HOSPITAL_COMMUNITY): Payer: 59 | Attending: Cardiology

## 2016-06-06 ENCOUNTER — Other Ambulatory Visit: Payer: Self-pay

## 2016-06-06 DIAGNOSIS — R011 Cardiac murmur, unspecified: Secondary | ICD-10-CM | POA: Diagnosis not present

## 2016-06-06 DIAGNOSIS — I340 Nonrheumatic mitral (valve) insufficiency: Secondary | ICD-10-CM | POA: Diagnosis not present

## 2017-03-09 ENCOUNTER — Encounter: Payer: Self-pay | Admitting: Neurology

## 2017-03-09 ENCOUNTER — Ambulatory Visit: Payer: BLUE CROSS/BLUE SHIELD | Admitting: Neurology

## 2017-03-09 VITALS — BP 135/77 | HR 85 | Ht 63.0 in | Wt 213.5 lb

## 2017-03-09 DIAGNOSIS — R202 Paresthesia of skin: Secondary | ICD-10-CM | POA: Diagnosis not present

## 2017-03-09 NOTE — Progress Notes (Signed)
PATIENT: Rebekah Reed DOB: 09/19/1970  Chief Complaint  Patient presents with  . Numbness    Reports numbness in bilateral hands since October 2018.  Denies neck pain.  Meloxicam has not been helpful.  She is here to discuss having NCV/EMG.  . PCP     HISTORICAL  Rebekah Reed is a 47 year old right-handed female, seen in refer by her primary care PA Toy Cookey for evaluation of bilateral hands numbness, initial evaluation was on March 09, 2017.  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  Shortly afterwards, she began to notice bilateral hands numbness tingling, intermittent, initially from sleep, she has to shake her hands to make the sensation come back, gradually getting worse, and more frequent now during the day, even holding a pencil writing on the board would cause numbness,  She denies neck pain, no weakness, no bilateral feet paresthesia no gait abnormality  Laboratory evaluations in January 2019 showed mild elevated LDL 127, normal CMP, creatinine of 0.82, normal CBC, with exception of mildly decreased WBC three-point, hemoglobin of 13.1, vitamin B12 was 330.  REVIEW OF SYSTEMS: Full 14 system review of systems performed and notable only for numbness  ALLERGIES: Allergies  Allergen Reactions  . Penicillins Hives and Swelling    Has patient had a PCN reaction causing immediate rash, facial/tongue/throat swelling, SOB or lightheadedness with hypotension: Yes Has patient had a PCN reaction causing severe rash involving mucus membranes or skin necrosis: No Has patient had a PCN reaction that required hospitalization No Has patient had a PCN reaction occurring within the last 10 years: No If all of the above answers are "NO", then may proceed with Cephalosporin use.     HOME MEDICATIONS: No current outpatient medications on file.   No  current facility-administered medications for this visit.     PAST MEDICAL HISTORY: Past Medical History:  Diagnosis Date  . Heart murmur    childhood  . Numbness and tingling in both hands     PAST SURGICAL HISTORY: Past Surgical History:  Procedure Laterality Date  . ABLATION    . HYSTEROSCOPY N/A 11/14/2014   Procedure: HYSTEROSCOPY WITH HYDROTHERMAL ABLATION;  Surgeon: Geryl Rankins, MD;  Location: [facility];  Service: Gynecology;  Laterality: N/A;  . IUD REMOVAL  11/14/2014   Procedure: INTRAUTERINE DEVICE (IUD) REMOVAL;  Surgeon: Geryl Rankins, MD;  Location: [facility];  Service: Gynecology;;  . TUBAL LIGATION      FAMILY HISTORY: Family History  Problem Relation Age of Onset  . Healthy Mother   . Lung cancer Father   . Colon cancer Father     SOCIAL HISTORY:  Social History   Socioeconomic History  . Marital status: Single    Spouse name: Not on file  . Number of children: 3  . Years of education: 76  . Highest education level: High school graduate  Social Needs  . Financial resource strain: Not on file  . Food insecurity - worry: Not on file  . Food insecurity - inability: Not on file  . Transportation needs - medical: Not on file  . Transportation needs - non-medical: Not on file  Occupational History  . Occupation: Retail  Tobacco Use  . Smoking status: Never Smoker  . Smokeless tobacco: Never Used  Substance and Sexual Activity  . Alcohol use: No  . Drug use: No  . Sexual activity: Yes  Birth control/protection: Surgical  Other Topics Concern  . Not on file  Social History Narrative   Lives at home with husband and children.   Right-handed.   3 cups per day.     PHYSICAL EXAM   Vitals:   03/09/17 0726  BP: 135/77  Pulse: 85  Weight: 213 lb 8 oz (96.8 kg)  Height: 5\' 3"  (1.6 m)    Not recorded      Body mass index is 37.82 kg/m.  PHYSICAL EXAMNIATION:  Gen: NAD, conversant, well nourised, obese, well groomed                      Cardiovascular: Regular rate rhythm, no peripheral edema, warm, nontender. Eyes: Conjunctivae clear without exudates or hemorrhage Neck: Supple, no carotid bruits. Pulmonary: Clear to auscultation bilaterally   NEUROLOGICAL EXAM:  MENTAL STATUS: Speech:    Speech is normal; fluent and spontaneous with normal comprehension.  Cognition:     Orientation to time, place and person     Normal recent and remote memory     Normal Attention span and concentration     Normal Language, naming, repeating,spontaneous speech     Fund of knowledge   CRANIAL NERVES: CN II: Visual fields are full to confrontation. Fundoscopic exam is normal with sharp discs and no vascular changes. Pupils are round equal and briskly reactive to light. CN III, IV, VI: extraocular movement are normal. No ptosis. CN V: Facial sensation is intact to pinprick in all 3 divisions bilaterally. Corneal responses are intact.  CN VII: Face is symmetric with normal eye closure and smile. CN VIII: Hearing is normal to rubbing fingers CN IX, X: Palate elevates symmetrically. Phonation is normal. CN XI: Head turning and shoulder shrug are intact CN XII: Tongue is midline with normal movements and no atrophy.  MOTOR: There is no pronator drift of out-stretched arms. Muscle bulk and tone are normal. Muscle strength is normal.  She has bilateral wrist Tinel signs  REFLEXES: Reflexes are 2+ and symmetric at the biceps, triceps, knees, and ankles. Plantar responses are flexor.  SENSORY:  mildly decreased to pinprick at bilateral first 3 finger pad  COORDINATION: Rapid alternating movements and fine finger movements are intact. There is no dysmetria on finger-to-nose and heel-knee-shin.    GAIT/STANCE: Posture is normal. Gait is steady with normal steps, base, arm swing, and turning. Heel and toe walking are normal. Tandem gait is normal.  Romberg is absent.   DIAGNOSTIC DATA (LABS, IMAGING, TESTING) - I reviewed patient  records, labs, notes, testing and imaging myself where available.   ASSESSMENT AND PLAN  Rebekah Reed is a 47 y.o. female   Bilateral hands paresthesia  Likely mild carpal tunnel syndrome  Check TSH, A1c  EMG nerve conduction study   Levert FeinsteinYijun Veleda Mun, M.D. Ph.D.  [facility] [address], [address] [place], [place] [address] Ph: [phone] Fax: [phone]  CC: Referring Provider

## 2017-03-11 LAB — TSH: TSH: 1.37 u[IU]/mL (ref 0.450–4.500)

## 2017-03-11 LAB — HGB A1C W/O EAG: Hgb A1c MFr Bld: 5.3 % (ref 4.8–5.6)

## 2017-03-11 LAB — SEDIMENTATION RATE: Sed Rate: 11 mm/hr (ref 0–32)

## 2017-03-11 LAB — C-REACTIVE PROTEIN: CRP: 3.4 mg/L (ref 0.0–4.9)

## 2017-03-11 LAB — ANA W/REFLEX: Anti Nuclear Antibody(ANA): NEGATIVE

## 2017-03-13 ENCOUNTER — Telehealth: Payer: Self-pay | Admitting: *Deleted

## 2017-03-13 NOTE — Telephone Encounter (Signed)
-----   Message from Levert FeinsteinYijun Yan, MD sent at 03/13/2017 10:10 AM EST ----- Please call patient for normal laboratory result

## 2017-03-13 NOTE — Telephone Encounter (Signed)
Left patient a detailed message, with results, on voicemail (ok per DPR).  Provided our number to call back with any questions.  

## 2017-03-22 ENCOUNTER — Encounter: Payer: BLUE CROSS/BLUE SHIELD | Admitting: Neurology

## 2017-03-22 ENCOUNTER — Ambulatory Visit (INDEPENDENT_AMBULATORY_CARE_PROVIDER_SITE_OTHER): Payer: BLUE CROSS/BLUE SHIELD | Admitting: Neurology

## 2017-03-22 DIAGNOSIS — R202 Paresthesia of skin: Secondary | ICD-10-CM | POA: Diagnosis not present

## 2017-03-22 DIAGNOSIS — Z0289 Encounter for other administrative examinations: Secondary | ICD-10-CM

## 2017-03-22 NOTE — Procedures (Signed)
Full Name: Rebekah Reed Gender: Female MRN #: [id number] Date of Birth: 01/01/1971    Visit Date: 03/22/2017 08:10 Age: [age] Years 5 Months Old Examining Physician: Levert FeinsteinYijun [place], MD  Referring Physician: Terrace ArabiaYan, MD History: 47 year old right-handed female, complains of intermittent bilateral hands paresthesia,  Summary of the test: Nerve conduction study: Bilateral median mixed responses were prolonged in comparison to ipsilateral ulnar mixed response.  Bilateral median sensory responses showed mildly prolonged peak latency with mild to moderately decreased snap amplitude.  Bilateral ulnar sensory and motor responses were normal.  Bilateral median motor responses showed mildly prolonged distal latency, left side more than right side.  Electromyography: Selective needle examinations of right upper extremity muscles and left upper extremity muscles showed no significant abnormality.  In specific, there is no significant abnormality noted at bilateral abductor pollicis brevis muscles.  Conclusion:  This is an abnormal study.  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   ------------------------------- Levert FeinsteinYIjun Danea Manter, M.D.  [facility] [address] [place], [place] [address] Tel: [phone] Fax: [phone]        [facility]    Nerve / Sites Muscle Latency Ref. Amplitude Ref. Rel Amp Segments Distance Velocity Ref. Area    ms ms mV mV %  cm m/s m/s mVms  L Median - APB     Wrist APB 4.3 ?4.4 6.5 ?4.0 100 Wrist - APB 7   22.3     Upper arm APB 7.4  6.7  102 Upper arm - Wrist 18 58 ?49 21.9  R Median - APB     Wrist APB 3.9 ?4.4 9.6 ?4.0 100 Wrist - APB 7   29.4     Upper arm APB 7.1  9.8  102 Upper arm - Wrist 18 56 ?49 29.6  L Ulnar - ADM     Wrist ADM 2.0 ?3.3 7.8 ?6.0 100 Wrist - ADM 7   22.3     B.Elbow ADM 5.0  7.5  95.9 B.Elbow - Wrist 17 56 ?49 21.7   A.Elbow ADM 6.8  7.2  96.2 A.Elbow - B.Elbow 10 56 ?49 21.7         A.Elbow - Wrist               SNC    Nerve / Sites Rec. Site Peak Lat Ref.  Amp Ref. Segments Distance Peak Diff Ref.    ms ms V V  cm ms ms  L Median, Ulnar - Transcarpal comparison     Median Palm Wrist 3.2 ?2.2 4 ?35 Median Palm - Wrist 8       Ulnar Palm Wrist 1.8 ?2.2 12 ?12 Ulnar Palm - Wrist 8          Median Palm - Ulnar Palm  1.4 ?0.4  R Median, Ulnar - Transcarpal comparison     Median Palm Wrist 3.4 ?2.2 18 ?35 Median Palm - Wrist 8       Ulnar Palm Wrist 1.6 ?2.2 30 ?12 Ulnar Palm - Wrist 8          Median Palm - Ulnar Palm  1.8 ?0.4  L Median - Orthodromic (Dig II, Mid palm)     Dig II Wrist 4.0 ?3.4 3 ?10 Dig II - Wrist 13    R Median - Orthodromic (Dig II, Mid palm)     Dig II Wrist 4.3 ?3.4 6 ?10  Dig II - Wrist 13    L Ulnar - Orthodromic, (Dig V, Mid palm)     Dig V Wrist 2.4 ?3.1 5 ?5 Dig V - Wrist 11    R Ulnar - Orthodromic, (Dig V, Mid palm)     Dig V Wrist 2.2 ?3.1 13 ?5 Dig V - Wrist 57                   F  Wave    Nerve F Lat Ref.   ms ms  L Ulnar - ADM 24.1 ?32.0       EMG full       EMG Summary Table    Spontaneous MUAP Recruitment  Muscle IA Fib PSW Fasc Other Amp Dur. Poly Pattern  R. Abductor pollicis brevis Normal None None None _______ Normal Normal Normal Normal  R. Pronator teres Normal None None None _______ Normal Normal Normal Normal  R. Flexor carpi radialis Normal None None None _______ Normal Normal Normal Normal  R. Flexor carpi ulnaris Normal None None None _______ Normal Normal Normal Normal  R. Deltoid Normal None None None _______ Normal Normal Normal Normal  R. Brachioradialis Normal None None None _______ Normal Normal Normal Normal  L. Abductor pollicis brevis Normal None None None _______ Normal Normal Normal Normal

## 2022-01-28 ENCOUNTER — Ambulatory Visit (INDEPENDENT_AMBULATORY_CARE_PROVIDER_SITE_OTHER): Payer: Self-pay | Admitting: Family Medicine

## 2022-01-28 ENCOUNTER — Encounter: Payer: Self-pay | Admitting: Family Medicine

## 2022-01-28 ENCOUNTER — Other Ambulatory Visit (HOSPITAL_COMMUNITY)
Admission: RE | Admit: 2022-01-28 | Discharge: 2022-01-28 | Disposition: A | Discharge status: Home / Self Care | Payer: Self-pay | Source: Ambulatory Visit | Attending: Family Medicine | Admitting: Family Medicine

## 2022-01-28 VITALS — BP 112/46 | HR 63 | Temp 98.3°F | Resp 16 | Ht 63.0 in | Wt 211.8 lb

## 2022-01-28 DIAGNOSIS — B9689 Other specified bacterial agents as the cause of diseases classified elsewhere: Secondary | ICD-10-CM

## 2022-01-28 DIAGNOSIS — Z113 Encounter for screening for infections with a predominantly sexual mode of transmission: Secondary | ICD-10-CM

## 2022-01-28 DIAGNOSIS — Z0000 Encounter for general adult medical examination without abnormal findings: Secondary | ICD-10-CM | POA: Insufficient documentation

## 2022-01-28 DIAGNOSIS — N760 Acute vaginitis: Secondary | ICD-10-CM

## 2022-01-28 LAB — COMPREHENSIVE METABOLIC PANEL
ALT: 28 U/L (ref 0–35)
AST: 17 U/L (ref 0–37)
Albumin: 4.4 g/dL (ref 3.5–5.2)
Alkaline Phosphatase: 99 U/L (ref 39–117)
BUN: 11 mg/dL (ref 6–23)
CO2: 30 mEq/L (ref 19–32)
Calcium: 9.5 mg/dL (ref 8.4–10.5)
Chloride: 102 mEq/L (ref 96–112)
Creatinine, Ser: 0.82 mg/dL (ref 0.40–1.20)
GFR: 82.32 mL/min (ref >60.00)
Glucose, Bld: 91 mg/dL (ref 70–99)
Potassium: 4.4 mEq/L (ref 3.5–5.1)
Sodium: 140 mEq/L (ref 135–145)
Total Bilirubin: 0.4 mg/dL (ref 0.2–1.2)
Total Protein: 6.9 g/dL (ref 6.0–8.3)

## 2022-01-28 LAB — CBC WITH DIFFERENTIAL/PLATELET
Basophils Absolute: 0.1 10*3/uL (ref 0.0–0.1)
Basophils Relative: 1.1 % (ref 0.0–3.0)
Eosinophils Absolute: 0.1 10*3/uL (ref 0.0–0.7)
Eosinophils Relative: 2.3 % (ref 0.0–5.0)
HCT: 42.6 % (ref 36.0–46.0)
Hemoglobin: 14.3 g/dL (ref 12.0–15.0)
Lymphocytes Relative: 34.1 % (ref 12.0–46.0)
Lymphs Abs: 1.7 10*3/uL (ref 0.7–4.0)
MCHC: 33.5 g/dL (ref 30.0–36.0)
MCV: 86.2 fl (ref 78.0–100.0)
Monocytes Absolute: 0.5 10*3/uL (ref 0.1–1.0)
Monocytes Relative: 9.2 % (ref 3.0–12.0)
Neutro Abs: 2.6 10*3/uL (ref 1.4–7.7)
Neutrophils Relative %: 53.3 % (ref 43.0–77.0)
Platelets: 206 10*3/uL (ref 150.0–400.0)
RBC: 4.94 Mil/uL (ref 3.87–5.11)
RDW: 13.9 % (ref 11.5–15.5)
WBC: 4.9 10*3/uL (ref 4.0–10.5)

## 2022-01-28 LAB — LIPID PANEL
Cholesterol: 214 mg/dL — ABNORMAL HIGH (ref 0–200)
HDL: 47 mg/dL (ref >39.00)
LDL Cholesterol: 138 mg/dL — ABNORMAL HIGH (ref 0–99)
NonHDL: 166.79
Total CHOL/HDL Ratio: 5
Triglycerides: 145 mg/dL (ref 0.0–149.0)
VLDL: 29 mg/dL (ref 0.0–40.0)

## 2022-01-28 NOTE — Progress Notes (Signed)
Complete physical exam  Patient: Rebekah Reed   DOB: 1970/09/13   52 y.o. Female  MRN: [id number]  Subjective:    Chief Complaint  Patient presents with   [place]    Rebekah Reed is a 52 y.o. female who presents today for a complete physical exam. She reports consuming a general diet. The patient does not participate in regular exercise at present. She generally feels well. She reports sleeping fair. She does not have additional problems to discuss today.   She is establishing care, no recent PCP. She delivers door dash.   Most recent fall risk assessment:    01/28/2022    9:26 AM  [place] in the past year? 0  Number falls in past yr: 0  Injury with Fall? 0     Most recent depression screenings:    01/28/2022    9:36 AM  PHQ 2/9 Scores  PHQ - 2 Score 0  PHQ- 9 Score 11      01/28/2022    9:37 AM  GAD 7 : Generalized Anxiety Score  Nervous, Anxious, on Edge 0  Control/stop worrying 0  Worry too much - different things 0  Trouble relaxing 1  Restless 2  Easily annoyed or irritable 1  Afraid - awful might happen 0  Total GAD 7 Score 4   Thinks she may have some ADD, but does not feel depression/anxiety. No SI/HI.    Vision:Not within last year , Dental: No current dental problems and No regular dental care , and STD: no symptoms currently, recently separate from husband, would like STD screening  (requesting Aptima swab, aware that it may be pricey, but she prefers to just get it done today and not have to go to health department or wait to find out exact costs)  Patient Active Problem List   Diagnosis Date Noted   Paresthesia 03/09/2017   Family History  Problem Relation Age of Onset   Healthy Mother    Lung cancer Father    Colon cancer Father    Allergies  Allergen Reactions   Penicillins Hives and Swelling    Has patient had a PCN reaction causing immediate rash, facial/tongue/throat swelling, SOB or lightheadedness with hypotension:  Yes Has patient had a PCN reaction causing severe rash involving mucus membranes or skin necrosis: No Has patient had a PCN reaction that required hospitalization No Has patient had a PCN reaction occurring within the last 10 years: No If all of the above answers are "NO", then may proceed with Cephalosporin use.       Patient Care Team: Rebekah Saver, NP as PCP - General (Family Medicine)   No outpatient medications prior to visit.   No facility-administered medications prior to visit.    ROS All review of systems negative except what is listed in the HPI        Objective:     BP (!) 112/46   Pulse 63   Temp 98.3 F (36.8 C)   Resp 16   Ht 5\' 3"  (1.6 m)   Wt 211 lb 12.8 oz (96.1 kg)   SpO2 98%   BMI 37.52 kg/m    Physical Exam Vitals reviewed.  Constitutional:      General: She is not in acute distress.    Appearance: Normal appearance. She is not ill-appearing.  HENT:     Head: Normocephalic and atraumatic.     Right Ear: Tympanic membrane normal.  Left Ear: Tympanic membrane normal.     Nose: Nose normal.     Mouth/Throat:     Mouth: Mucous membranes are moist.     Pharynx: Oropharynx is clear.  Eyes:     Extraocular Movements: Extraocular movements intact.     Conjunctiva/sclera: Conjunctivae normal.     Pupils: Pupils are equal, round, and reactive to light.  Neck:     Vascular: No carotid bruit.  Cardiovascular:     Rate and Rhythm: Normal rate and regular rhythm.     Pulses: Normal pulses.     Heart sounds: Murmur heard.  Pulmonary:     Effort: Pulmonary effort is normal.     Breath sounds: Normal breath sounds.  Abdominal:     General: Abdomen is flat. Bowel sounds are normal. There is no distension.     Palpations: Abdomen is soft. There is no mass.     Tenderness: There is no abdominal tenderness. There is no right CVA tenderness, left CVA tenderness, guarding or rebound.  Genitourinary:    Comments: Deferred exam Musculoskeletal:         General: Normal range of motion.     Cervical back: Normal range of motion and neck supple. No tenderness.     Right lower leg: No edema.     Left lower leg: No edema.  Lymphadenopathy:     Cervical: No cervical adenopathy.  Skin:    General: Skin is warm and dry.     Capillary Refill: Capillary refill takes less than 2 seconds.  Neurological:     General: No focal deficit present.     Mental Status: She is alert and oriented to person, place, and time. Mental status is at baseline.  Psychiatric:        Mood and Affect: Mood normal.        Behavior: Behavior normal.        Thought Content: Thought content normal.        Judgment: Judgment normal.          No results found for any visits on 01/28/22.     Assessment & Plan:    Routine Health Maintenance and Physical Exam   There is no immunization history on file for this patient.  Health Maintenance  Topic Date Due   COVID-19 Vaccine (1) Never done   HIV Screening  Never done   Hepatitis C Screening  Never done   DTaP/Tdap/Td (1 - Tdap) Never done   PAP SMEAR-Modifier  Never done   COLONOSCOPY (Pts 45-58yrs Insurance coverage will need to be confirmed)  Never done   MAMMOGRAM  Never done   Zoster Vaccines- Shingrix (1 of 2) Never done   INFLUENZA VACCINE  04/24/2022 (Originally 08/24/2021)   HPV VACCINES  Aged Out  - currently self pay, she will think about updating HM and look into self-pay pricing for each  Discussed health benefits of physical activity, and encouraged her to engage in regular exercise appropriate for her age and condition.  Problem List Items Addressed This Visit   None Visit Diagnoses     Annual physical exam    -  Primary   Relevant Orders   CBC with Differential/Platelet   Comprehensive metabolic panel   Lipid panel   Cervicovaginal ancillary only   Screen for STD (sexually transmitted disease)    Advised patient that swab will likely be expensive and that she could look into  health department for cheaper alternative, but she wants to  proceed with testing today. Does not want to add addition STD blood work at this time. No current symptoms.     Relevant Orders   Cervicovaginal ancillary only      Return in about 1 year (around 01/29/2023) for physical.     Rebekah Saver, NP

## 2022-01-28 NOTE — Patient Instructions (Signed)
Thank you for choosing [facility] for your Primary Care needs. I am excited for the opportunity to partner with you to meet your health care goals. It was a pleasure meeting you today!   Information on diet, exercise, and health maintenance recommendations are listed below. This is information to help you be sure you are on track for optimal health and monitoring.   Please look over this and let us know if you have any questions or if you have completed any of the health maintenance outside of [place] so that we can be sure your records are up to date.  ___________________________________________________________  MyChart:  For all urgent or time sensitive needs we ask that you please call the office to avoid delays. Our number is [phone]. MyChart is not constantly monitored and due to the large volume of messages a day, replies may take up to 72 business hours.  MyChart Policy: MyChart allows for you to see your visit notes, after visit summary, provider recommendations, lab and tests results, make an appointment, request refills, and contact your provider or the office for non-urgent questions or concerns. Providers are seeing patients during normal business hours and do not have built in time to review MyChart messages.  We ask that you allow a minimum of 3 business days for responses to MyChart messages. For this reason, please do not send urgent requests through MyChart. Please call the office at [phone]. New and ongoing conditions may require a visit. We have virtual and in-person visits available for your convenience.  Complex MyChart concerns may require a visit. Your provider may request you schedule a virtual or in-person visit to ensure we are providing the best care possible. MyChart messages sent after 11:00 AM on Friday will not be received by the provider until Monday morning.    Lab and Test Results: You will receive your lab and  test results on MyChart as soon as they are completed and results have been sent by the lab or testing facility. Due to this service, you will receive your results BEFORE your provider.  I review lab and test results each morning prior to seeing patients. Some results require collaboration with other providers to ensure you are receiving the most appropriate care. For this reason, we ask that you please allow a minimum of 3-5 business days from the time that ALL results have been received for your provider to receive and review lab and test results and contact you about these.  Most lab and test result comments from the provider will be sent through MyChart. Your provider may recommend changes to the plan of care, follow-up visits, repeat testing, ask questions, or request an office visit to discuss these results. You may reply directly to this message or call the office to provide information for the provider or set up an appointment. In some instances, you will be called with test results and recommendations. Please let us know if this is preferred and we will make note of this in your chart to provide this for you.    If you have not heard a response to your lab or test results in 5 business days from all results returning to MyChart, please call the office to let us know. We ask that you please avoid calling prior to this time unless there is an emergent concern. Due to high call volumes, this can delay the resulting process.  After Hours: For all non-emergency after hours needs,   please call the office at [phone] and select the option to reach the on-call  service. On-call services are shared between multiple [facility] [place] offices and therefore it will not be possible to speak directly with your provider. On-call providers may provide medical advice and recommendations, but are unable to provide refills for maintenance medications.  For all emergency or urgent medical needs after normal business  hours, we recommend that you seek care at the closest Urgent Care or Emergency Department to ensure appropriate treatment in a timely manner.  [facility] has a 24 hour emergency room located on the ground floor for your convenience.   Urgent Concerns During the Business Day Providers are seeing patients from 8AM to 5PM with a busy schedule and are most often not able to respond to non-urgent calls until the end of the day or the next business day. If you should have URGENT concerns during the day, please call and speak to the nurse or schedule a same day appointment so that we can address your concern without delay.   Thank you, again, for choosing me as your health care partner. I appreciate your trust and look forward to learning more about you.   Dakoda Laventure B. Caddie Randle, DNP, FNP-C  ___________________________________________________________  Health Maintenance Recommendations Screening Testing Mammogram Every 1-2 years based on history and risk factors Starting at age 50 Pap Smear Ages 21-39 every 3 years Ages 30-65 every 5 years with HPV testing More frequent testing may be required based on results and history Colon Cancer Screening Every 1-10 years based on test performed, risk factors, and history Starting at age 45 Bone Density Screening Every 2-10 years based on history Starting at age 65 for women Recommendations for men differ based on medication usage, history, and risk factors AAA Screening One time ultrasound Men 65-75 years old who have ever smoked Lung Cancer Screening Low Dose Lung CT every 12 months Age 50-80 years with a 20 pack-year smoking history who still smoke or who have quit within the last 15 years  Screening Labs Routine  Labs: Complete Blood Count (CBC), Complete Metabolic Panel (CMP), Cholesterol (Lipid Panel) Every 6-12 months based on history and medications May be recommended more frequently based on current conditions or  previous results Hemoglobin A1c Lab Every 3-12 months based on history and previous results Starting at age 45 or earlier with diagnosis of diabetes, high cholesterol, BMI >26, and/or risk factors Frequent monitoring for patients with diabetes to ensure blood sugar control Thyroid Panel (TSH w/ T3 & T4) Every 6 months based on history, symptoms, and risk factors May be repeated more often if on medication HIV One time testing for all patients 13 and older May be repeated more frequently for patients with increased risk factors or exposure Hepatitis C One time testing for all patients 18 and older May be repeated more frequently for patients with increased risk factors or exposure Gonorrhea, Chlamydia Every 12 months for all sexually active persons 13-24 years Additional monitoring may be recommended for those who are considered high risk or who have symptoms PSA Men 40-54 years old with risk factors Additional screening may be recommended from age 55-69 based on risk factors, symptoms, and history  Vaccine Recommendations Tetanus Booster All adults every 10 years Flu Vaccine All patients 6 months and older every year COVID Vaccine All patients 12 years and older Initial dosing with booster May recommend additional booster based on age and health history HPV Vaccine 2 doses all patients   age 9-26 Dosing may be considered for patients over 26 Shingles Vaccine (Shingrix) 2 doses all adults 50 years and older Pneumonia (Pneumovax 23) All adults 65 years and older May recommend earlier dosing based on health history Pneumonia (Prevnar 13) All adults 65 years and older Dosed 1 year after Pneumovax 23 Pneumonia (Prevnar 20) All adults 65 years and older (adults 19-64 with certain conditions or risk factors) 1 dose  For those who have no received Prevnar 13 vaccine previously   Additional Screening, Testing, and Vaccinations may be recommended on an individualized basis based on  family history, health history, risk factors, and/or exposure.  __________________________________________________________  Diet Recommendations for All Patients  I recommend that all patients maintain a diet low in saturated fats, carbohydrates, and cholesterol. While this can be challenging at first, it is not impossible and small changes can make big differences.  Things to try: Decreasing the amount of soda, sweet tea, and/or juice to one or less per day and replace with water While water is always the first choice, if you do not like water you may consider adding a water additive without sugar to improve the taste other sugar free drinks Replace potatoes with a brightly colored vegetable at dinner Use healthy oils, such as canola oil or olive oil, instead of butter or hard margarine Limit your bread intake to two pieces or less a day Replace regular pasta with low carb pasta options Bake, broil, or grill foods instead of frying Monitor portion sizes  Eat smaller, more frequent meals throughout the day instead of large meals  An important thing to remember is, if you love foods that are not great for your health, you don't have to give them up completely. Instead, allow these foods to be a reward when you have done well. Allowing yourself to still have special treats every once in a while is a nice way to tell yourself thank you for working hard to keep yourself healthy.   Also remember that every day is a new day. If you have a bad day and "fall off the wagon", you can still climb right back up and keep moving along on your journey!  We have resources available to help you!  Some websites that may be helpful include: www.MyPlate.gov  Www.VeryWellFit.com _____________________________________________________________  Activity Recommendations for All Patients  I recommend that all adults get at least 20 minutes of moderate physical activity that elevates your heart rate at least 5  days out of the week.  Some examples include: Walking or jogging at a pace that allows you to carry on a conversation Cycling (stationary bike or outdoors) Water aerobics Yoga Weight lifting Dancing If physical limitations prevent you from putting stress on your joints, exercise in a pool or seated in a chair are excellent options.  Do determine your MAXIMUM heart rate for activity: 220 - YOUR AGE = MAX Heart Rate   Remember! Do not push yourself too hard.  Start slowly and build up your pace, speed, weight, time in exercise, etc.  Allow your body to rest between exercise and get good sleep. You will need more water than normal when you are exerting yourself. Do not wait until you are thirsty to drink. Drink with a purpose of getting in at least 8, 8 ounce glasses of water a day plus more depending on how much you exercise and sweat.    If you begin to develop dizziness, chest pain, abdominal pain, jaw pain, shortness of breath, headache,   vision changes, lightheadedness, or other concerning symptoms, stop the activity and allow your body to rest. If your symptoms are severe, seek emergency evaluation immediately. If your symptoms are concerning, but not severe, please let us know so that we can recommend further evaluation.     

## 2022-01-31 LAB — CERVICOVAGINAL ANCILLARY ONLY
Bacterial Vaginitis (gardnerella): POSITIVE — AB
Candida Glabrata: NEGATIVE
Candida Vaginitis: NEGATIVE
Chlamydia: NEGATIVE
Comment: NEGATIVE
Comment: NEGATIVE
Comment: NEGATIVE
Comment: NEGATIVE
Comment: NEGATIVE
Comment: NORMAL
Neisseria Gonorrhea: NEGATIVE
Trichomonas: NEGATIVE

## 2022-02-01 MED ORDER — METRONIDAZOLE 500 MG PO TABS: 500.0000 mg | ORAL_TABLET | Freq: Two times a day (BID) | ORAL | 0 refills | Status: AC

## 2022-02-01 NOTE — Addendum Note (Signed)
Addended by: Caleen Jobs B on: 02/01/2022 07:53 AM   Modules accepted: Orders

## 2024-01-16 ENCOUNTER — Other Ambulatory Visit: Payer: Self-pay

## 2024-01-16 ENCOUNTER — Ambulatory Visit
Admission: EM | Admit: 2024-01-16 | Discharge: 2024-01-16 | Disposition: A | Discharge status: Home / Self Care | Payer: Self-pay | Attending: Emergency Medicine | Admitting: Emergency Medicine

## 2024-01-16 ENCOUNTER — Ambulatory Visit: Payer: Self-pay

## 2024-01-16 DIAGNOSIS — R300 Dysuria: Secondary | ICD-10-CM | POA: Insufficient documentation

## 2024-01-16 DIAGNOSIS — N3001 Acute cystitis with hematuria: Secondary | ICD-10-CM | POA: Insufficient documentation

## 2024-01-16 LAB — POCT URINE DIPSTICK
Bilirubin, UA: NEGATIVE
Glucose, UA: NEGATIVE mg/dL
Ketones, POC UA: NEGATIVE mg/dL
Nitrite, UA: NEGATIVE
POC PROTEIN,UA: NEGATIVE
Spec Grav, UA: 1.015
Urobilinogen, UA: 0.2 U/dL
pH, UA: 5.5

## 2024-01-16 MED ORDER — SULFAMETHOXAZOLE-TRIMETHOPRIM 800-160 MG PO TABS: 1.0000 | ORAL_TABLET | Freq: Two times a day (BID) | ORAL | 0 refills | Status: AC

## 2024-01-16 NOTE — ED Provider Notes (Signed)
 " [facility]    CSN: [id number] Arrival date & time: 01/16/24  1055      History   Chief Complaint Chief Complaint  Patient presents with   Dysuria   Urinary Frequency    HPI Rebekah Reed is a 53 y.o. female.   Patient presents to clinic over concern of urinary urgency, frequency, dysuria at the end of urination and hematuria that started suddenly last night around 8pm.   She has not noticed any more hematuria today, continues to have the urgency and frequency Was up all night urinating, felt like she cannot fully empty her bladder Has not had nausea, vomiting, abdominal pain, fever or flank pain Has not tried medications or interventions prior to arrival  The history is provided by the patient and medical records.  Dysuria Urinary Frequency    Past Medical History:  Diagnosis Date   Heart murmur    childhood   Numbness and tingling in both hands     Patient Active Problem List   Diagnosis Date Noted   Paresthesia 03/09/2017    Past Surgical History:  Procedure Laterality Date   ABLATION     HYSTEROSCOPY N/A 11/14/2014   Procedure: HYSTEROSCOPY WITH HYDROTHERMAL ABLATION;  Surgeon: Hargis Paradise, MD;  Location: [facility];  Service: Gynecology;  Laterality: N/A;   IUD REMOVAL  11/14/2014   Procedure: INTRAUTERINE DEVICE (IUD) REMOVAL;  Surgeon: Hargis Paradise, MD;  Location: [facility];  Service: Gynecology;;   TUBAL LIGATION      OB History   No obstetric history on file.      Home Medications    Prior to Admission medications  Medication Sig Start Date End Date Taking? Authorizing Provider  sulfamethoxazole -trimethoprim  (BACTRIM  DS) 800-160 MG tablet Take 1 tablet by mouth 2 (two) times daily for 3 days. 01/16/24 01/19/24 Yes Dreama, Deyon Chizek  N, FNP    Family History Family History  Problem Relation Age of Onset   Healthy Mother    Lung cancer Father    Colon cancer Father     Social History Social History[1]   Allergies    Penicillins   Review of Systems Review of Systems  Per HPI  Physical Exam Triage Vital Signs ED Triage Vitals  Encounter Vitals Group     BP 01/16/24 1251 123/78     Girls Systolic BP Percentile --      Girls Diastolic BP Percentile --      Boys Systolic BP Percentile --      Boys Diastolic BP Percentile --      Pulse Rate 01/16/24 1251 84     Resp 01/16/24 1251 18     Temp 01/16/24 1251 98.3 F (36.8 C)     Temp Source 01/16/24 1251 Oral     SpO2 01/16/24 1251 94 %     Weight 01/16/24 1250 220 lb (99.8 kg)     Height 01/16/24 1250 5' 3 (1.6 m)     Head Circumference --      Peak Flow --      Pain Score 01/16/24 1250 3     Pain Loc --      Pain Education --      Exclude from Growth Chart --    No data found.  Updated Vital Signs BP 123/78 (BP Location: Right Arm)   Pulse 84   Temp 98.3 F (36.8 C) (Oral)   Resp 18   Ht 5' 3 (1.6 m)   Wt 220 lb (99.8 kg)  SpO2 94%   BMI 38.97 kg/m   Visual Acuity Right Eye Distance:   Left Eye Distance:   Bilateral Distance:    Right Eye Near:   Left Eye Near:    Bilateral Near:     Physical Exam Vitals and nursing note reviewed.  Constitutional:      Appearance: Normal appearance.  HENT:     Head: Normocephalic and atraumatic.     Right Ear: External ear normal.     Left Ear: External ear normal.     Nose: Nose normal.     Mouth/Throat:     Mouth: Mucous membranes are moist.  Eyes:     Conjunctiva/sclera: Conjunctivae normal.  Cardiovascular:     Rate and Rhythm: Normal rate.  Pulmonary:     Effort: Pulmonary effort is normal. No respiratory distress.  Abdominal:     Tenderness: There is no right CVA tenderness or left CVA tenderness.  Skin:    General: Skin is warm and dry.  Neurological:     General: No focal deficit present.     Mental Status: She is alert.  Psychiatric:        Mood and Affect: Mood normal.      UC Treatments / Results  Labs (all labs ordered are listed, but only abnormal  results are displayed) Labs Reviewed  POCT URINE DIPSTICK - Abnormal; Notable for the following components:      Result Value   Blood, UA trace-intact (*)    Leukocytes, UA Small (1+) (*)    All other components within normal limits  URINE CULTURE    EKG   Radiology No results found.  Procedures Procedures (including critical care time)  Medications Ordered in UC Medications - No data to display  Initial Impression / Assessment and Plan / UC Course  I have reviewed the triage vital signs and the nursing notes.  Pertinent labs & imaging results that were available during my care of the patient were reviewed by me and considered in my medical decision making (see chart for details).  Vitals and triage reviewed, patient is hemodynamically stable.  Afebrile, without tachycardia, nausea, vomiting, flank pain or CVA tenderness, low clinical concern for pyelonephritis.  UA result does show blood and leukocytes.  Will send for culture.  Started on Bactrim  over concerns for acute cystitis.  Symptomatic management for dysuria discussed.  Plan of care, follow-up care and return precautions given, no questions at this time.    Final Clinical Impressions(s) / UC Diagnoses   Final diagnoses:  Dysuria  Acute cystitis with hematuria     Discharge Instructions      Take the Bactrim  twice daily with food for the next 3 days Ensure you are drinking at least 64 ounces of water daily to flush the kidneys You can take over-the-counter AZO to numb the urinary tract to help with dysuria AZO can change the color of your urine to a highlighter yellow/orange color  Symptoms should improve over the next few days, if no improvement or any changes seek follow-up care.  Staff will contact if we need to change her antibiotics based on her urine culture.     ED Prescriptions     Medication Sig Dispense Auth. Provider   sulfamethoxazole -trimethoprim  (BACTRIM  DS) 800-160 MG tablet Take 1  tablet by mouth 2 (two) times daily for 3 days. 6 tablet Dreama, Jerris Fleer  N, FNP      PDMP not reviewed this encounter.    [1]  Social History  Tobacco Use   Smoking status: Never   Smokeless tobacco: Never  Vaping Use   Vaping status: Never Used  Substance Use Topics   Alcohol use: No   Drug use: No     Dreama Beuford Garcilazo  N, FNP 01/16/24 1323  "

## 2024-01-16 NOTE — Discharge Instructions (Addendum)
 Take the Bactrim  twice daily with food for the next 3 days Ensure you are drinking at least 64 ounces of water daily to flush the kidneys You can take over-the-counter AZO to numb the urinary tract to help with dysuria AZO can change the color of your urine to a highlighter yellow/orange color  Symptoms should improve over the next few days, if no improvement or any changes seek follow-up care.  Staff will contact if we need to change her antibiotics based on her urine culture.

## 2024-01-16 NOTE — ED Triage Notes (Signed)
 Pt presents with c/o dysuria and urinary frequency. Symptoms began last night at approximately 8 PM. Noticed blood when wiping after urinating. Was up throughout the night. Felt like she could not empty her bladder completely. Currently rates overall pain a 3/10. Pain increases with urination.

## 2024-01-16 NOTE — Telephone Encounter (Signed)
 Pt going to [place]

## 2024-01-16 NOTE — Telephone Encounter (Signed)
 FYI Only or Action Required?: FYI only for provider: no appointments, proceeding to urgent care .  Patient was last seen in primary care on 01/28/2022 by Rebekah Waddell NOVAK, NP.  Called Nurse Triage reporting Dysuria.  Symptoms began several days ago.  Interventions attempted: Nothing.  Symptoms are: gradually worsening.  Triage Disposition: See Physician Within 24 Hours  Patient/caregiver understands and will follow disposition?: Yes  Copied from CRM [id number]. Topic: Clinical - Red Word Triage >> Jan 16, 2024  8:34 AM Rebekah Reed wrote: Red Word that prompted transfer to Nurse Triage: Pt called in stating that she is having burning urination, specs of blood when wiping and also frequent urination. Warm transferred to nurse triage. Reason for Disposition  Blood in urine (red, pink, or tea-colored)  Answer Assessment - Initial Assessment Questions Additional info: No appointments available in clinic, proceeding to urgent care.   1. SEVERITY: How bad is the pain?  (e.g., Scale 1-10; mild, moderate, or severe)     burning 2. FREQUENCY: How many times have you had painful urination today?      Increased frequency 3. PATTERN: Is pain present every time you urinate or just sometimes?      Each toward end of void 4. ONSET: When did the painful urination start?      Saturday  5. FEVER: Do you have a fever? If Yes, ask: What is your temperature, how was it measured, and when did it start?     Denies  6. PAST UTI: Have you had a urine infection before? If Yes, ask: When was the last time? and What happened that time?      Denies never had uti 7. CAUSE: What do you think is causing the painful urination?  (e.g., UTI, scratch, Herpes sore)     uti 8. OTHER SYMPTOMS: Do you have any other symptoms? (e.g., blood in urine, flank pain, genital sores, urgency, vaginal discharge)     Blood on paper when wiping, bladder pressure. History of uterine ablation, no  menses.  Protocols used: Urination Pain - Female-A-AH

## 2024-01-17 ENCOUNTER — Ambulatory Visit: Payer: Self-pay | Admitting: Family Medicine

## 2024-01-18 LAB — URINE CULTURE: Culture: 30000 — AB

## 2024-01-19 ENCOUNTER — Ambulatory Visit (HOSPITAL_COMMUNITY): Payer: Self-pay
